# Patient Record
Sex: Male | Born: 1960 | Race: White | Hispanic: No | Marital: Married | Smoking: Light tobacco smoker
Health system: Southern US, Community
[De-identification: ages and names within clinical notes are randomized; demographics above are authoritative.]

## PROBLEM LIST (undated history)

## (undated) DIAGNOSIS — J189 Pneumonia, unspecified organism: Secondary | ICD-10-CM

## (undated) DIAGNOSIS — I429 Cardiomyopathy, unspecified: Secondary | ICD-10-CM

## (undated) DIAGNOSIS — I5022 Chronic systolic (congestive) heart failure: Secondary | ICD-10-CM

## (undated) DIAGNOSIS — I4892 Unspecified atrial flutter: Secondary | ICD-10-CM

## (undated) DIAGNOSIS — K760 Fatty (change of) liver, not elsewhere classified: Secondary | ICD-10-CM

## (undated) HISTORY — PX: VASECTOMY: SHX75

## (undated) HISTORY — DX: Morbid (severe) obesity due to excess calories: E66.01

## (undated) HISTORY — DX: Fatty (change of) liver, not elsewhere classified: K76.0

## (undated) HISTORY — DX: Cardiomyopathy, unspecified: I42.9

## (undated) HISTORY — DX: Chronic systolic (congestive) heart failure: I50.22

## (undated) HISTORY — DX: Unspecified atrial flutter: I48.92

## (undated) HISTORY — PX: ACHILLES TENDON REPAIR: SUR1153

## (undated) SURGERY — ECHOCARDIOGRAM, TRANSESOPHAGEAL
Anesthesia: Moderate Sedation

---
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
## 2010-12-11 HISTORY — PX: HERNIA REPAIR: SHX51

## 2012-12-11 DIAGNOSIS — G4730 Sleep apnea, unspecified: Secondary | ICD-10-CM | POA: Insufficient documentation

## 2013-12-11 DIAGNOSIS — G4730 Sleep apnea, unspecified: Secondary | ICD-10-CM

## 2013-12-11 DIAGNOSIS — K219 Gastro-esophageal reflux disease without esophagitis: Secondary | ICD-10-CM

## 2013-12-11 HISTORY — DX: Sleep apnea, unspecified: G47.30

## 2013-12-11 HISTORY — DX: Gastro-esophageal reflux disease without esophagitis: K21.9

## 2014-12-11 DIAGNOSIS — T7840XA Allergy, unspecified, initial encounter: Secondary | ICD-10-CM

## 2014-12-11 HISTORY — DX: Allergy, unspecified, initial encounter: T78.40XA

## 2017-12-11 DIAGNOSIS — E119 Type 2 diabetes mellitus without complications: Secondary | ICD-10-CM

## 2017-12-11 HISTORY — DX: Type 2 diabetes mellitus without complications: E11.9

## 2018-02-08 DIAGNOSIS — E119 Type 2 diabetes mellitus without complications: Secondary | ICD-10-CM | POA: Insufficient documentation

## 2019-06-03 ENCOUNTER — Encounter: Payer: Self-pay | Admitting: Family Medicine

## 2019-06-03 ENCOUNTER — Ambulatory Visit: Payer: Federal, State, Local not specified - PPO | Admitting: Family Medicine

## 2019-06-03 ENCOUNTER — Ambulatory Visit (INDEPENDENT_AMBULATORY_CARE_PROVIDER_SITE_OTHER): Payer: Federal, State, Local not specified - PPO

## 2019-06-03 ENCOUNTER — Other Ambulatory Visit: Payer: Self-pay

## 2019-06-03 VITALS — BP 142/78 | HR 96 | Temp 98.2°F | Resp 20 | Wt 350.0 lb

## 2019-06-03 DIAGNOSIS — R0789 Other chest pain: Secondary | ICD-10-CM

## 2019-06-03 DIAGNOSIS — E119 Type 2 diabetes mellitus without complications: Secondary | ICD-10-CM

## 2019-06-03 DIAGNOSIS — E291 Testicular hypofunction: Secondary | ICD-10-CM

## 2019-06-03 DIAGNOSIS — Z87891 Personal history of nicotine dependence: Secondary | ICD-10-CM | POA: Diagnosis not present

## 2019-06-04 DIAGNOSIS — R079 Chest pain, unspecified: Secondary | ICD-10-CM | POA: Diagnosis not present

## 2019-06-05 ENCOUNTER — Telehealth: Payer: Self-pay | Admitting: Family Medicine

## 2019-06-05 ENCOUNTER — Encounter: Payer: Self-pay | Admitting: Family Medicine

## 2019-06-05 ENCOUNTER — Other Ambulatory Visit: Payer: Self-pay | Admitting: Family Medicine

## 2019-06-05 DIAGNOSIS — Z87891 Personal history of nicotine dependence: Secondary | ICD-10-CM | POA: Insufficient documentation

## 2019-06-05 DIAGNOSIS — R0789 Other chest pain: Secondary | ICD-10-CM | POA: Insufficient documentation

## 2019-06-05 DIAGNOSIS — E291 Testicular hypofunction: Secondary | ICD-10-CM | POA: Insufficient documentation

## 2019-06-05 MED ORDER — MELOXICAM 15 MG PO TABS: 15.0000 mg | ORAL_TABLET | Freq: Every day | ORAL | 2 refills | Status: DC

## 2019-06-05 NOTE — Telephone Encounter (Signed)
Please see result note 

## 2019-06-05 NOTE — Progress Notes (Signed)
Please apologize for the delay, trouble getting XR to cross over in system.  CXR is normal, no signs of lung issues contributing to this. .  There are some spurring/arthritis in mid spine which may be causing some radiation of pain to that area.  I would recommend trying prescription anti-inflammatory initially, I will send in if ok with him.

## 2019-06-05 NOTE — Telephone Encounter (Signed)
Pt call request x-ray that was done yesterday result, pt still has pain and not sure what to do next. Please give him a call today if possible.

## 2019-06-05 NOTE — Progress Notes (Signed)
David RatelGeorge Strawser - 58 y.o. male MRN [id number]  Date of birth: 01/28/1961  Subjective Chief Complaint  Patient presents with  . Chest Pain    R "breast area" , onset 2.5 wks after [place]  [place] [facility],     HPI David Hammond is a [age] y.o. male with history of T2DM, hypogonadism, arthritis, GERD and OSA.  [place] [facility] [place]  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  Reports having labs completed just prior to moving from [place] and will have records transferred over and that chronic conditions as under good control.  His main concern today is pain located along R side of chest.   Worse with positional movements.  Denies any know injury or overuse.  Not worse with exertion and denies shortness of breath or tightness in chest.  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    ROS:  A comprehensive ROS was completed and negative except as noted per HPI  Allergies  Allergen Reactions  . Trazodone Palpitations and Shortness Of Breath    Past Medical History:  Diagnosis Date  . Allergy 2016   Trazodone  . Arthritis 1967   Knees  . Diabetes mellitus without complication (HCC) 2019  . GERD (gastroesophageal reflux disease) 2015  . Sleep apnea 2015   Controlled with Tx CPAP    Past Surgical History:  Procedure Laterality Date  . HERNIA REPAIR  2012   Umbilical    Social History   Socioeconomic History  . Marital status: Married    Spouse name: Not on file  . Number of children: Not on file  . Years of education: Not on file  . Highest education level: Not on file  Occupational History  . Not on file  Social Needs  . Financial resource strain: Not hard at all  . Food insecurity    Worry: Never true    Inability: Never true  . Transportation needs    Medical: Not on file    Non-medical: No  Tobacco Use  . Smoking status: Light Tobacco Smoker    Packs/day: 0.00    Years: 40.00   Pack years: 0.00    Types: Pipe  . Smokeless tobacco: Never Used  . Tobacco comment: 1-2 pipes per day. Used to smoke cigarettes. Quit about 2015. Use nicotine replacement gum/mints.  Substance and Sexual Activity  . Alcohol use: Yes  . Drug use: Never  . Sexual activity: Yes    Partners: Female    Birth control/protection: None    Comment: Vasectomy  Lifestyle  . Physical activity    Days per week: 7 days    Minutes per session: Not on file  . Stress: Only a little  Relationships  . Social Musicianconnections    Talks on phone: Three times a week    Gets together: Twice a week    Attends religious service: Not on file    Active member of club or organization: Not on file    Attends meetings of clubs or organizations: Not on file    Relationship status: Married  Other Topics Concern  . Not on file  Social History Narrative  . Not on file    Family History  Problem Relation Age of Onset  . Arthritis Father   . Cancer Father   . COPD Father   . Diabetes Father   . Heart disease Father   .  Hyperlipidemia Father   . Hypertension Father   . Kidney disease Father   . Stroke Father   . Arthritis Mother   . Varicose Veins Mother     Health Maintenance  Topic Date Due  . Hepatitis C Screening  1961-07-07  . URINE MICROALBUMIN  01/17/1972  . HIV Screening  01/16/1977  . TETANUS/TDAP  01/16/1981  . COLONOSCOPY  01/17/2012  . INFLUENZA VACCINE  07/12/2019    ----------------------------------------------------------------------------------------------------------------------------------------------------------------------------------------------------------------- Physical Exam BP (!) 142/78   Pulse 96   Temp 98.2 F (36.8 C) (Oral)   Resp 20   Wt (!) 350 lb (158.8 kg)   SpO2 96%   Physical Exam Constitutional:      Appearance: He is well-developed. He is obese.  HENT:     Head: Normocephalic and atraumatic.  Eyes:     General: No scleral icterus. Cardiovascular:      Rate and Rhythm: Normal rate and regular rhythm.  Pulmonary:     Effort: Pulmonary effort is normal.     Breath sounds: Normal breath sounds.  Chest:     Chest wall: No tenderness.  Abdominal:     General: There is no distension.     Palpations: Abdomen is soft.     Tenderness: There is no abdominal tenderness.  Skin:    General: Skin is warm and dry.  Neurological:     General: No focal deficit present.     Mental Status: He is alert.  Psychiatric:        Mood and Affect: Mood normal.        Behavior: Behavior normal.     ------------------------------------------------------------------------------------------------------------------------------------------------------------------------------------------------------------------- Assessment and Plan  Hypogonadism male -Reports having testosterone levels check recently at pcp in [place], will request records.   Diabetes mellitus ([place]) -Reports glucose has been well controlled with metformin, continue.   Chest wall pain -Xray ordered with smoking history and occupational exposure while in [place].   -Likely MSK cause if CXR negative.

## 2019-06-05 NOTE — Assessment & Plan Note (Signed)
-  Reports glucose has been well controlled with metformin, continue.

## 2019-06-05 NOTE — Assessment & Plan Note (Signed)
-  Reports having testosterone levels check recently at pcp in [place], will request records.

## 2019-06-05 NOTE — Telephone Encounter (Signed)
Spoke with the pt already.

## 2019-06-05 NOTE — Assessment & Plan Note (Signed)
-  Xray ordered with smoking history and occupational exposure while in [place].   -Likely MSK cause if CXR negative.

## 2019-06-05 NOTE — Progress Notes (Signed)
No records yet, will check upon return on Monday.

## 2019-07-28 ENCOUNTER — Other Ambulatory Visit: Payer: Self-pay | Admitting: Family Medicine

## 2019-07-28 ENCOUNTER — Telehealth: Payer: Self-pay | Admitting: Family Medicine

## 2019-07-28 MED ORDER — TESTOSTERONE CYPIONATE 200 MG/ML IM SOLN: INTRAMUSCULAR | 0 refills | Status: DC

## 2019-07-28 MED ORDER — "SYRINGE/NEEDLE (DISP) 23G X 1"" 1 ML MISC": 0 refills | Status: DC

## 2019-07-28 MED ORDER — "SYRINGE/NEEDLE (DISP) 18G X 1"" 1 ML MISC": 0 refills | Status: DC

## 2019-07-28 NOTE — Telephone Encounter (Signed)
I still haven't received copies of his records from [place].  Will fill temporarily until records received but need to know current dose and how often he is administering.

## 2019-07-28 NOTE — Telephone Encounter (Signed)
Pt is requesting a Rx for Testosterone Cypionate 150 MG/ML SOLN sent in to the pharmacy. Pt also need the needles as well.   Draw with [facility] with 23 or 25 by 1 in half   Pharmacy:  [facility] [place], [address] [place] [phone] (Phone) [phone] (Fax)

## 2019-07-28 NOTE — Telephone Encounter (Signed)
Please advise 

## 2019-07-28 NOTE — Telephone Encounter (Signed)
Sent pt mychart message to make him aware.

## 2019-07-28 NOTE — Telephone Encounter (Signed)
Rx sent in

## 2019-07-28 NOTE — Telephone Encounter (Signed)
Pt wife states that pt is using testosterone cypionate 200mg /ML. Pt wife states that he is using 0.75 ML every 2 weeks and that they would need syringes sent as well. Pt wife requested release form to be mailed to pt house and they will get this taken care of.

## 2019-08-29 DIAGNOSIS — M9903 Segmental and somatic dysfunction of lumbar region: Secondary | ICD-10-CM | POA: Diagnosis not present

## 2019-08-29 DIAGNOSIS — M9904 Segmental and somatic dysfunction of sacral region: Secondary | ICD-10-CM | POA: Diagnosis not present

## 2019-08-29 DIAGNOSIS — M791 Myalgia, unspecified site: Secondary | ICD-10-CM | POA: Diagnosis not present

## 2019-08-29 DIAGNOSIS — M5386 Other specified dorsopathies, lumbar region: Secondary | ICD-10-CM | POA: Diagnosis not present

## 2019-09-17 ENCOUNTER — Other Ambulatory Visit: Payer: Self-pay | Admitting: Family Medicine

## 2019-09-17 NOTE — Telephone Encounter (Signed)
Requested medication (s) are due for refill today: yes  Requested medication (s) are on the active medication list: yes  Last refill:  06/03/2019  Future visit scheduled: yes  Notes to clinic:  Review for refill   Requested Prescriptions  Pending Prescriptions Disp Refills   omeprazole (PRILOSEC) 20 MG capsule        Gastroenterology: Proton Pump Inhibitors Passed - 09/17/2019  3:27 PM      Passed - Valid encounter within last 12 months    Recent Outpatient Visits          3 months ago Chest wall pain   [facility] [place] [facility] [place], [place], DO      Future Appointments            In 4 weeks Corum, Rex Kras, MD [place]            metFORMIN (GLUCOPHAGE) 1000 MG tablet        Endocrinology:  Diabetes - Biguanides Failed - 09/17/2019  3:27 PM      Failed - Cr in normal range and within 360 days    No results found for: CREATININE       Failed - HBA1C is between 0 and 7.9 and within 180 days    No results found for: HGBA1C       Failed - eGFR in normal range and within 360 days    No results found for: GFRAA, GFRNONAA, GFR       Passed - Valid encounter within last 6 months    Recent Outpatient Visits          3 months ago Chest wall pain   [facility] [place], [place], DO      Future Appointments            In 4 weeks Corum, Rex Kras, MD [place]

## 2019-09-17 NOTE — Telephone Encounter (Signed)
Medication Refill - Medication:  metFORMIN (GLUCOPHAGE) 1000 MG tablet [[id number]]  omeprazole (PRILOSEC) 20 MG capsule [[id number]]   Has the patient contacted their pharmacy? No. (Agent: If no, request that the patient contact the pharmacy for the refill.) (Agent: If yes, when and what did the pharmacy advise?)  Preferred Pharmacy (with phone number or street name):  [facility] [phone] - [place], [address] [place] [phone] (Phone)     Agent: Please be advised that RX refills may take up to 3 business days. We ask that you follow-up with your pharmacy.

## 2019-09-18 MED ORDER — METFORMIN HCL 1000 MG PO TABS: 1000.0000 mg | ORAL_TABLET | Freq: Two times a day (BID) | ORAL | 1 refills | Status: DC

## 2019-09-18 MED ORDER — OMEPRAZOLE 20 MG PO CPDR: 20.0000 mg | DELAYED_RELEASE_CAPSULE | Freq: Every day | ORAL | 2 refills | Status: DC

## 2019-10-15 ENCOUNTER — Encounter: Payer: Self-pay | Admitting: Family Medicine

## 2019-10-15 ENCOUNTER — Other Ambulatory Visit: Payer: Self-pay

## 2019-10-15 ENCOUNTER — Ambulatory Visit (INDEPENDENT_AMBULATORY_CARE_PROVIDER_SITE_OTHER): Payer: Federal, State, Local not specified - PPO | Admitting: Family Medicine

## 2019-10-15 VITALS — BP 130/82 | HR 92 | Temp 98.3°F | Ht 72.0 in | Wt 325.2 lb

## 2019-10-15 DIAGNOSIS — Z23 Encounter for immunization: Secondary | ICD-10-CM

## 2019-10-15 DIAGNOSIS — E119 Type 2 diabetes mellitus without complications: Secondary | ICD-10-CM

## 2019-10-15 DIAGNOSIS — G8929 Other chronic pain: Secondary | ICD-10-CM | POA: Insufficient documentation

## 2019-10-15 DIAGNOSIS — E291 Testicular hypofunction: Secondary | ICD-10-CM | POA: Diagnosis not present

## 2019-10-15 DIAGNOSIS — F17200 Nicotine dependence, unspecified, uncomplicated: Secondary | ICD-10-CM

## 2019-10-15 DIAGNOSIS — M549 Dorsalgia, unspecified: Secondary | ICD-10-CM

## 2019-10-15 LAB — POCT UA - MICROALBUMIN
A1c: <30
Creatinine, POC: 100 mg/dL
Microalbumin Ur, POC: 30 mg/L

## 2019-10-15 MED ORDER — OZEMPIC (0.25 OR 0.5 MG/DOSE) 2 MG/1.5ML ~~LOC~~ SOPN: 0.5000 mg | PEN_INJECTOR | SUBCUTANEOUS | 5 refills | Status: DC

## 2019-10-15 NOTE — Progress Notes (Signed)
New Patient Office Visit  Subjective:  Patient ID: David Hammond, male    DOB: [birth year]  Age: 58 y.o. MRN: [id number]  CC:  Chief Complaint  Patient presents with  . Establish Care  . Leg Pain    numbness in toes ,cramps   . Diabetes    HPI David Hammond presents for A1c-9.8%-pt states at home kit-no change in diet, metformin BID-pt on Ozempic in the past but has not had the medication recently.  Pt states his levels were normal on ozempic. -pt states constipation a concern even on metformin pt states pain increases sugar levels Lost weight since June-10lb/month Peripheral neuropathy-toes primarily Leg cramps bilat No blood work x 10 months-recent move, no doctor Cholesterol normal in the past Blood pressure normal in the past Uses testosterone  Past Medical History:  Diagnosis Date  . Allergy 2016   Trazodone  . Arthritis 1967   Knees  . Diabetes mellitus without complication ([place]) 2263  . GERD (gastroesophageal reflux disease) 2015  . Sleep apnea 2015   Controlled with Tx CPAP    Past Surgical History:  Procedure Laterality Date  . HERNIA REPAIR  3354   Umbilical    Family History  Problem Relation Age of Onset  . Arthritis Father   . Cancer Father   . COPD Father   . Diabetes Father   . Heart disease Father   . Hyperlipidemia Father   . Hypertension Father   . Kidney disease Father   . Stroke Father   . Arthritis Mother   . Varicose Veins Mother     Social History  Older children, remarried-considering another child with current wife Socioeconomic History  . Marital status: Married    Spouse name: Not on file  . Number of children: Not on file  . Years of education: Not on file  . Highest education level: Not on file  Occupational History  . Not on file  Social Needs  . Financial resource strain: Not hard at all  . Food insecurity    Worry: Never true    Inability: Never true  . Transportation needs    Medical: Not on file     Non-medical: No  Tobacco Use  . Smoking status: Light Tobacco Smoker    Packs/day: 0.00    Years: 40.00    Pack years: 0.00    Types: Pipe  . Smokeless tobacco: Never Used  . Tobacco comment: 1-2 pipes per day. Used to smoke cigarettes. Quit about 2015. Use nicotine replacement gum/mints.  Substance and Sexual Activity  . Alcohol use: Yes  . Drug use: Never  . Sexual activity: Yes    Partners: Female    Birth control/protection: None    Comment: Vasectomy  Lifestyle  . Physical activity    Days per week: 7 days    Minutes per session: Not on file  . Stress: Only a little  Relationships  . Social Herbalist on phone: Three times a week    Gets together: Twice a week    Attends religious service: Not on file    Active member of club or organization: Not on file    Attends meetings of clubs or organizations: Not on file    Relationship status: Married  . Intimate partner violence    Fear of current or ex partner: No    Emotionally abused: No    Physically abused: No    Forced sexual activity: No  Other Topics Concern  .  Not on file  Social History Narrative  . Not on file    ROS Review of Systems  HENT: Negative.   Respiratory: Positive for wheezing. Negative for chest tightness.   Cardiovascular: Positive for leg swelling.  Gastrointestinal:       GERD   Endocrine: Positive for polyphagia and polyuria.       Diagnosis of DM  Genitourinary: Positive for frequency and urgency.  Musculoskeletal: Positive for arthralgias, back pain and myalgias.  Skin: Positive for color change.       LE  Allergic/Immunologic: Negative.   Neurological: Positive for numbness.       Peripheral neurpathy  Hematological: Negative.   Psychiatric/Behavioral: Positive for decreased concentration.    Objective:   Today's Vitals: BP 130/82 (BP Location: Left Arm, Patient Position: Sitting, Cuff Size: Normal)   Pulse 92   Temp 98.3 F (36.8 C) (Oral)   Ht 6' (1.829 m)    Wt (!) 325 lb 3.2 oz (147.5 kg)   SpO2 94%   BMI 44.11 kg/m   Physical Exam Constitutional:      Appearance: Normal appearance. He is obese.  HENT:     Head: Normocephalic.     Right Ear: Tympanic membrane, ear canal and external ear normal.     Left Ear: Tympanic membrane, ear canal and external ear normal.     Nose: Nose normal.  Eyes:     Conjunctiva/sclera: Conjunctivae normal.  Neck:     Musculoskeletal: Normal range of motion and neck supple.  Cardiovascular:     Rate and Rhythm: Normal rate and regular rhythm.     Pulses: Normal pulses.     Heart sounds: Normal heart sounds.  Pulmonary:     Effort: Pulmonary effort is normal.     Breath sounds: Normal breath sounds.  Abdominal:     Palpations: Abdomen is soft.  Musculoskeletal:     Right lower leg: Edema present.     Left lower leg: Edema present.  Neurological:     Mental Status: He is alert and oriented to person, place, and time.     Assessment & Plan:   1. 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 Smoker - Ambulatory referral to Vascular Surgery LE edema, decrease sensation 3. Hypogonadism male Endo-refer-h/o of testosterone use 4. Chronic midline back pain, unspecified back location  Follow-up:  3 months  LISA Hannah Beat, MD

## 2019-10-15 NOTE — Patient Instructions (Signed)
Diabetes Mellitus and Nutrition, Adult When you have diabetes (diabetes mellitus), it is very important to have healthy eating habits because your blood sugar (glucose) levels are greatly affected by what you eat and drink. Eating healthy foods in the appropriate amounts, at about the same times every day, can help you:  Control your blood glucose.  Lower your risk of heart disease.  Improve your blood pressure.  Reach or maintain a healthy weight. Every person with diabetes is different, and each person has different needs for a meal plan. Your health care provider may recommend that you work with a diet and nutrition specialist (dietitian) to make a meal plan that is best for you. Your meal plan may vary depending on factors such as:  The calories you need.  The medicines you take.  Your weight.  Your blood glucose, blood pressure, and cholesterol levels.  Your activity level.  Other health conditions you have, such as heart or kidney disease. How do carbohydrates affect me? Carbohydrates, also called carbs, affect your blood glucose level more than any other type of food. Eating carbs naturally raises the amount of glucose in your blood. Carb counting is a method for keeping track of how many carbs you eat. Counting carbs is important to keep your blood glucose at a healthy level, especially if you use insulin or take certain oral diabetes medicines. It is important to know how many carbs you can safely have in each meal. This is different for every person. Your dietitian can help you calculate how many carbs you should have at each meal and for each snack. Foods that contain carbs include:  Bread, cereal, rice, pasta, and crackers.  Potatoes and corn.  Peas, beans, and lentils.  Milk and yogurt.  Fruit and juice.  Desserts, such as cakes, cookies, ice cream, and candy. How does alcohol affect me? Alcohol can cause a sudden decrease in blood glucose (hypoglycemia),  especially if you use insulin or take certain oral diabetes medicines. Hypoglycemia can be a life-threatening condition. Symptoms of hypoglycemia (sleepiness, dizziness, and confusion) are similar to symptoms of having too much alcohol. If your health care provider says that alcohol is safe for you, follow these guidelines:  Limit alcohol intake to no more than 1 drink per day for nonpregnant women and 2 drinks per day for men. One drink equals 12 oz of beer, 5 oz of wine, or 1 oz of hard liquor.  Do not drink on an empty stomach.  Keep yourself hydrated with water, diet soda, or unsweetened iced tea.  Keep in mind that regular soda, juice, and other mixers may contain a lot of sugar and must be counted as carbs. What are tips for following this plan?  Reading food labels  Start by checking the serving size on the "Nutrition Facts" label of packaged foods and drinks. The amount of calories, carbs, fats, and other nutrients listed on the label is based on one serving of the item. Many items contain more than one serving per package.  Check the total grams (g) of carbs in one serving. You can calculate the number of servings of carbs in one serving by dividing the total carbs by 15. For example, if a food has 30 g of total carbs, it would be equal to 2 servings of carbs.  Check the number of grams (g) of saturated and trans fats in one serving. Choose foods that have low or no amount of these fats.  Check the number of   milligrams (mg) of salt (sodium) in one serving. Most people should limit total sodium intake to less than 2,300 mg per day.  Always check the nutrition information of foods labeled as "low-fat" or "nonfat". These foods may be higher in added sugar or refined carbs and should be avoided.  Talk to your dietitian to identify your daily goals for nutrients listed on the label. Shopping  Avoid buying canned, premade, or processed foods. These foods tend to be high in fat, sodium,  and added sugar.  Shop around the outside edge of the grocery store. This includes fresh fruits and vegetables, bulk grains, fresh meats, and fresh dairy. Cooking  Use low-heat cooking methods, such as baking, instead of high-heat cooking methods like deep frying.  Cook using healthy oils, such as olive, canola, or sunflower oil.  Avoid cooking with butter, cream, or high-fat meats. Meal planning  Eat meals and snacks regularly, preferably at the same times every day. Avoid going long periods of time without eating.  Eat foods high in fiber, such as fresh fruits, vegetables, beans, and whole grains. Talk to your dietitian about how many servings of carbs you can eat at each meal.  Eat 4-6 ounces (oz) of lean protein each day, such as lean meat, chicken, fish, eggs, or tofu. One oz of lean protein is equal to: ? 1 oz of meat, chicken, or fish. ? 1 egg. ?  cup of tofu.  Eat some foods each day that contain healthy fats, such as avocado, nuts, seeds, and fish. Lifestyle  Check your blood glucose regularly.  Exercise regularly as told by your health care provider. This may include: ? 150 minutes of moderate-intensity or vigorous-intensity exercise each week. This could be brisk walking, biking, or water aerobics. ? Stretching and doing strength exercises, such as yoga or weightlifting, at least 2 times a week.  Take medicines as told by your health care provider.  Do not use any products that contain nicotine or tobacco, such as cigarettes and e-cigarettes. If you need help quitting, ask your health care provider.  Work with a Veterinary surgeon or diabetes educator to identify strategies to manage stress and any emotional and social challenges. Questions to ask a health care provider  Do I need to meet with a diabetes educator?  Do I need to meet with a dietitian?  What number can I call if I have questions?  When are the best times to check my blood glucose? Where to find more  information:  American Diabetes Association: diabetes.org  Academy of Nutrition and Dietetics: www.eatright.[employer] of Diabetes and Digestive and Kidney Diseases (NIH): CarFlippers.tn Summary  A healthy meal plan will help you control your blood glucose and maintain a healthy lifestyle.  Working with a diet and nutrition specialist (dietitian) can help you make a meal plan that is best for you.  Keep in mind that carbohydrates (carbs) and alcohol have immediate effects on your blood glucose levels. It is important to count carbs and to use alcohol carefully. This information is not intended to replace advice given to you by your health care provider. Make sure you discuss any questions you have with your health care provider. Document Released: 08/24/2005 Document Revised: 11/09/2017 Document Reviewed: 01/01/2017 Elsevier Patient Education  2020 [employer] Diabetes Mellitus and Foot Care Foot care is an important part of your health, especially when you have diabetes. Diabetes may cause you to have problems because of poor blood flow (circulation) to your feet and  legs, which can cause your skin to:  Become thinner and drier.  Break more easily.  Heal more slowly.  Peel and crack. You may also have nerve damage (neuropathy) in your legs and feet, causing decreased feeling in them. This means that you may not notice minor injuries to your feet that could lead to more serious problems. Noticing and addressing any potential problems early is the best way to prevent future foot problems. How to care for your feet Foot hygiene  Wash your feet daily with warm water and mild soap. Do not use hot water. Then, pat your feet and the areas between your toes until they are completely dry. Do not soak your feet as this can dry your skin.  Trim your toenails straight across. Do not dig under them or around the cuticle. File the edges of your nails with an emery board or nail  file.  Apply a moisturizing lotion or petroleum jelly to the skin on your feet and to dry, brittle toenails. Use lotion that does not contain alcohol and is unscented. Do not apply lotion between your toes. Shoes and socks  Wear clean socks or stockings every day. Make sure they are not too tight. Do not wear knee-high stockings since they may decrease blood flow to your legs.  Wear shoes that fit properly and have enough cushioning. Always look in your shoes before you put them on to be sure there are no objects inside.  To break in new shoes, wear them for just a few hours a day. This prevents injuries on your feet. Wounds, scrapes, corns, and calluses  Check your feet daily for blisters, cuts, bruises, sores, and redness. If you cannot see the bottom of your feet, use a mirror or ask someone for help.  Do not cut corns or calluses or try to remove them with medicine.  If you find a minor scrape, cut, or break in the skin on your feet, keep it and the skin around it clean and dry. You may clean these areas with mild soap and water. Do not clean the area with peroxide, alcohol, or iodine.  If you have a wound, scrape, corn, or callus on your foot, look at it several times a day to make sure it is healing and not infected. Check for: ? Redness, swelling, or pain. ? Fluid or blood. ? Warmth. ? Pus or a bad smell. General instructions  Do not cross your legs. This may decrease blood flow to your feet.  Do not use heating pads or hot water bottles on your feet. They may burn your skin. If you have lost feeling in your feet or legs, you may not know this is happening until it is too late.  Protect your feet from hot and cold by wearing shoes, such as at the beach or on hot pavement.  Schedule a complete foot exam at least once a year (annually) or more often if you have foot problems. If you have foot problems, report any cuts, sores, or bruises to your health care provider immediately.  Contact a health care provider if:  You have a medical condition that increases your risk of infection and you have any cuts, sores, or bruises on your feet.  You have an injury that is not healing.  You have redness on your legs or feet.  You feel burning or tingling in your legs or feet.  You have pain or cramps in your legs and feet.  Your legs  or feet are numb.  Your feet always feel cold.  You have pain around a toenail. Get help right away if:  You have a wound, scrape, corn, or callus on your foot and: ? You have pain, swelling, or redness that gets worse. ? You have fluid or blood coming from the wound, scrape, corn, or callus. ? Your wound, scrape, corn, or callus feels warm to the touch. ? You have pus or a bad smell coming from the wound, scrape, corn, or callus. ? You have a fever. ? You have a red line going up your leg. Summary  Check your feet every day for cuts, sores, red spots, swelling, and blisters.  Moisturize feet and legs daily.  Wear shoes that fit properly and have enough cushioning.  If you have foot problems, report any cuts, sores, or bruises to your health care provider immediately.  Schedule a complete foot exam at least once a year (annually) or more often if you have foot problems. This information is not intended to replace advice given to you by your health care provider. Make sure you discuss any questions you have with your health care provider. Document Released: 11/24/2000 Document Revised: 01/09/2018 Document Reviewed: 12/29/2016 Elsevier Patient Education  2020 [employer].

## 2019-10-19 DIAGNOSIS — Z23 Encounter for immunization: Secondary | ICD-10-CM | POA: Insufficient documentation

## 2019-11-03 ENCOUNTER — Encounter: Payer: Self-pay | Admitting: "Endocrinology

## 2019-11-03 ENCOUNTER — Ambulatory Visit: Payer: Federal, State, Local not specified - PPO | Admitting: "Endocrinology

## 2019-11-03 ENCOUNTER — Other Ambulatory Visit: Payer: Self-pay

## 2019-11-03 ENCOUNTER — Telehealth: Payer: Self-pay

## 2019-11-03 VITALS — BP 134/86 | HR 82 | Ht 72.0 in | Wt 320.0 lb

## 2019-11-03 DIAGNOSIS — E291 Testicular hypofunction: Secondary | ICD-10-CM

## 2019-11-03 DIAGNOSIS — E1165 Type 2 diabetes mellitus with hyperglycemia: Secondary | ICD-10-CM

## 2019-11-03 MED ORDER — OZEMPIC (1 MG/DOSE) 2 MG/1.5ML ~~LOC~~ SOPN: 1.0000 mg | PEN_INJECTOR | SUBCUTANEOUS | 2 refills | Status: DC

## 2019-11-03 NOTE — Patient Instructions (Signed)

## 2019-11-03 NOTE — Telephone Encounter (Signed)
Dr. Dorris Fetch and I are both concerned with pts LE perfusion. Uncontrolled DM, tob abuse , LE edema, peripheral neuropathy.  I am happy to discuss with vascular physician or scheduling person for the office.  Dr. Dorris Fetch and I have both examined the patient and feel additional evaluation is warranted.

## 2019-11-03 NOTE — Progress Notes (Addendum)
Endocrinology Consult Note       11/03/2019, 5:40 PM   Subjective:    Patient ID: David Hammond, male    DOB: 1961/08/01.  David Hammond is being seen in consultation for management of currently uncontrolled symptomatic diabetes requested by  David Hancock, MD.   Past Medical History:  Diagnosis Date  . Allergy 2016   Trazodone  . Arthritis 1967   Knees  . Diabetes mellitus without complication ([place]) 2111  . GERD (gastroesophageal reflux disease) 2015  . Sleep apnea 2015   Controlled with Tx CPAP    Past Surgical History:  Procedure Laterality Date  . HERNIA REPAIR  5520   Umbilical    Social History   Socioeconomic History  . Marital status: Married    Spouse name: Not on file  . Number of children: Not on file  . Years of education: Not on file  . Highest education level: Not on file  Occupational History  . Not on file  Social Needs  . Financial resource strain: Not hard at all  . Food insecurity    Worry: Never true    Inability: Never true  . Transportation needs    Medical: Not on file    Non-medical: No  Tobacco Use  . Smoking status: Light Tobacco Smoker    Packs/day: 0.00    Years: 40.00    Pack years: 0.00    Types: Pipe  . Smokeless tobacco: Never Used  . Tobacco comment: 1-2 pipes per day. Used to smoke cigarettes. Quit about 2015. Use nicotine replacement gum/mints.  Substance and Sexual Activity  . Alcohol use: Yes  . Drug use: Never  . Sexual activity: Yes    Partners: Female    Birth control/protection: None    Comment: Vasectomy  Lifestyle  . Physical activity    Days per week: 7 days    Minutes per session: Not on file  . Stress: Only a little  Relationships  . Social Herbalist on phone: Three times a week    Gets together: Twice a week    Attends religious service: Not on file    Active member of club or organization: Not on file     Attends meetings of clubs or organizations: Not on file    Relationship status: Married  Other Topics Concern  . Not on file  Social History Narrative  . Not on file    Family History  Problem Relation Age of Onset  . Arthritis Father   . Cancer Father   . COPD Father   . Diabetes Father   . Heart disease Father   . Hyperlipidemia Father   . Hypertension Father   . Kidney disease Father   . Stroke Father   . Arthritis Mother   . Varicose Veins Mother     Outpatient Encounter Medications as of 11/03/2019  Medication Sig  . metFORMIN (GLUCOPHAGE) 1000 MG tablet Take 1 tablet (1,000 mg total) by mouth 2 (two) times daily with a meal.  . Needles & Syringes MISC   . Needles & Syringes MISC   . omeprazole (  PRILOSEC) 20 MG capsule Take 1 capsule (20 mg total) by mouth daily.  . Semaglutide, 1 MG/DOSE, (OZEMPIC, 1 MG/DOSE,) 2 MG/1.5ML SOPN Inject 1 mg into the skin once a week.  . SYRINGE/NEEDLE, DISP, 1 ML 23G X 1" 1 ML MISC Use to administer testosterone  . Syringe/Needle, Disp, 18G X 1" 1 ML MISC Use to draw up testosterone  . testosterone cypionate (DEPOTESTOSTERONE CYPIONATE) 200 MG/ML injection Inject 0.70m into muscle every 2 weeks. Use 18 gauge to draw up and 23 gauge to administer.  .Marland KitchentiZANidine (ZANAFLEX) 4 MG capsule Take 4 mg by mouth 3 (three) times daily as needed for muscle spasms.  . [DISCONTINUED] Semaglutide,0.25 or 0.5MG/DOS, (OZEMPIC, 0.25 OR 0.5 MG/DOSE,) 2 MG/1.5ML SOPN Inject 0.5 mg into the skin once a week.   No facility-administered encounter medications on file as of 11/03/2019.     ALLERGIES: Allergies  Allergen Reactions  . Trazodone Palpitations and Shortness Of Breath    VACCINATION STATUS: Immunization History  Administered Date(s) Administered  . Influenza,inj,Quad PF,6+ Mos 10/15/2019  . Tdap 10/15/2019    Diabetes He presents for his initial diabetic visit. He has type 2 diabetes mellitus. Onset time: He was diagnosed at approximate age  of 89years. His disease course has been worsening. There are no hypoglycemic associated symptoms. Pertinent negatives for hypoglycemia include no confusion, headaches, pallor or seizures. Associated symptoms include polydipsia, polyphagia and polyuria. Pertinent negatives for diabetes include no chest pain, no fatigue and no weakness. There are no hypoglycemic complications. Symptoms are worsening. Diabetic complications include PVD. Risk factors for coronary artery disease include diabetes mellitus, obesity, male sex, tobacco exposure, sedentary lifestyle and family history. Current diabetic treatments: He is currently on Metformin 500 mg p.o. twice daily, Ozempic 0.5 mg weekly. Compliance with diabetes treatment: This is his first visit. His weight is fluctuating minimally. He is following a generally unhealthy diet. When asked about meal planning, he reported none. He has not had a previous visit with a dietitian. He participates in exercise intermittently. (He did not bring any logs nor meter to review today.  His recent A1c was 9.8% on home A1c kit. ) An ACE inhibitor/angiotensin II receptor blocker is not being taken.     Review of Systems  Constitutional: Negative for chills, fatigue, fever and unexpected weight change.  HENT: Negative for dental problem, mouth sores and trouble swallowing.   Eyes: Negative for visual disturbance.  Respiratory: Negative for cough, choking, chest tightness, shortness of breath and wheezing.   Cardiovascular: Negative for chest pain, palpitations and leg swelling.  Gastrointestinal: Negative for abdominal distention, abdominal pain, constipation, diarrhea, nausea and vomiting.  Endocrine: Positive for polydipsia, polyphagia and polyuria.  Genitourinary: Negative for dysuria, flank pain, hematuria and urgency.  Musculoskeletal: Negative for back pain, gait problem, myalgias and neck pain.       Complains of muscle cramps on bilateral lower extremities.  Skin:  Negative for pallor, rash and wound.  Neurological: Negative for seizures, syncope, weakness, numbness and headaches.  Psychiatric/Behavioral: Negative for confusion and dysphoric mood.    Objective:    BP 134/86   Pulse 82   Ht 6' (1.829 m)   Wt (!) 320 lb (145.2 kg)   BMI 43.40 kg/m   Wt Readings from Last 3 Encounters:  11/03/19 (!) 320 lb (145.2 kg)  10/15/19 (!) 325 lb 3.2 oz (147.5 kg)  06/03/19 (!) 350 lb (158.8 kg)     Physical Exam Constitutional:      General:  He is not in acute distress.    Appearance: He is well-developed.  HENT:     Head: Normocephalic and atraumatic.  Neck:     Musculoskeletal: Normal range of motion and neck supple.     Thyroid: No thyromegaly.     Trachea: No tracheal deviation.  Cardiovascular:     Rate and Rhythm: Normal rate.     Pulses:          Dorsalis pedis pulses are 1+ on the right side and 1+ on the left side.       Posterior tibial pulses are 1+ on the right side and 1+ on the left side.     Heart sounds: S1 normal and S2 normal. No murmur. No gallop.      Comments: Diminished pulses on bilateral lower extremities, and evident venostasis.    Pulmonary:     Effort: Pulmonary effort is normal. No respiratory distress.     Breath sounds: No wheezing.  Abdominal:     General: Bowel sounds are normal. There is no distension.     Palpations: Abdomen is soft.     Tenderness: There is no abdominal tenderness. There is no guarding.  Musculoskeletal:     Right shoulder: He exhibits no swelling and no deformity.  Skin:    General: Skin is warm and dry.     Findings: No rash.     Nails: There is no clubbing.   Neurological:     Mental Status: He is alert and oriented to person, place, and time.     Cranial Nerves: No cranial nerve deficit.     Sensory: No sensory deficit.     Gait: Gait normal.     Deep Tendon Reflexes: Reflexes are normal and symmetric.  Psychiatric:        Speech: Speech normal.        Behavior: Behavior  normal. Behavior is cooperative.        Thought Content: Thought content normal.        Judgment: Judgment normal.     Comments: Patient has reluctant affect.    Recent A1c was 9.8%, 4 weeks ago.   Assessment & Plan:   1. Uncontrolled type 2 diabetes mellitus with hyperglycemia (HCC)  - Charlene Cowdrey has currently uncontrolled symptomatic type 2 DM since  58 years of age,  with most recent A1c of 9.8 %. Recent labs reviewed. - I had a long discussion with him about the progressive nature of diabetes and the pathology behind its complications. -his diabetes is complicated by peripheral arterial disease, obesity/sedentary life, chronic  smoking and he remains at a high risk for more acute and chronic complications which include CAD, CVA, CKD, retinopathy, and neuropathy. These are all discussed in detail with him.  - I have counseled him on diet  and weight management  by adopting a carbohydrate restricted/protein rich diet. Patient is encouraged to switch to  unprocessed or minimally processed     complex starch and increased protein intake (animal or plant source), fruits, and vegetables. -  he is advised to stick to a routine mealtimes to eat 3 meals  a day and avoid unnecessary snacks ( to snack only to correct hypoglycemia).   - he admits that there is a room for improvement in his food and drink choices. - Suggestion is made for him to avoid simple carbohydrates  from his diet including Cakes, Sweet Desserts, Ice Cream, Soda (diet and regular), Sweet Tea, Candies, Chips, Cookies, Store  Bought Juices, Alcohol in Excess of  1-2 drinks a day, Artificial Sweeteners,  Coffee Creamer, and "Sugar-free" Products. This will help patient to have more stable blood glucose profile and potentially avoid unintended weight gain.  - he will be scheduled with Jearld Fenton, RDN, CDE for diabetes education.  - I have approached him with the following individualized plan to manage  his diabetes and  patient agrees:   -Given his recent diagnosis, he may control his diabetes with his current medications at optimal doses.  I discussed increasing Metformin to 1000 mg p.o. twice daily, increase his Ozempic to 1 mg subcutaneously weekly. -He is approached to start monitoring blood glucose at least once a day- daily before breakfast.  - he is encouraged to call clinic for blood glucose levels less than 70 or above 200 mg /dl. - he is not a candidate for SGLT2 inhibitors due to evident peripheral arterial disease and smoking , increasing his risk for gangrene.    - he will be considered for incretin therapy as appropriate next visit.  - Specific targets for  A1c;  LDL, HDL, Triglycerides, and  Waist Circumference were discussed with the patient.  -2.  History of hypogonadism -Patient is on ongoing testosterone supplement, not very sure about his doses, however he states 1 mL every 2 weeks. -The circumstances of his diagnosis with hypothyroidism are not available to review.  [place] -I did not change his doses during this visit.  His next labs will include total and free testosterone, ferritin, prolactin. -Dose adjustment will be made based on necessity and safety.  2) Blood Pressure /Hypertension:  his blood pressure is  controlled to target.  He is not on any antihypertensive medications.  3) Lipids/Hyperlipidemia: No recent lipid panel to review.  His next labs will include fasting lipid panel.    4)  Weight/Diet:  Body mass index is 43.4 kg/m.  -   clearly complicating his diabetes care.   he is  a candidate for weight loss. I discussed with him the fact that loss of 5 - 10% of his  current body weight will have the most impact on his diabetes management.  Exercise, and detailed carbohydrates information provided  -  detailed on discharge instructions.  5) Chronic Care/Health Maintenance:  -he  Is not on ACEI/ARB and Statin medications and   is encouraged to initiate and continue to follow up with Ophthalmology, Dentist,  Podiatrist at least yearly or according to recommendations, and advised to  Quit smoking. I have recommended yearly flu vaccine and pneumonia vaccine at least every 5 years; moderate intensity exercise for up to 150 minutes weekly; and  sleep for at least 7 hours a day.  - he will benefit from evaluation by vascular surgery to assess his peripheral circulation.  - he is  advised to maintain close follow up with Corum, Rex Kras, MD for primary care needs, as well as his other providers for optimal and coordinated care.  - Time spent with the patient: 60 minutes, of which >50% was spent in obtaining information about his symptoms, reviewing his previous labs/studies, evaluations, and treatments, counseling him about his currently uncontrolled, complicated type 2 diabetes; hypothyroidism; obesity, and developing plans for long term treatment based on the latest standards of care/guidelines.  Please refer to " Patient Self Inventory" in the Media  tab for reviewed elements of pertinent patient history.  Maylene Roes participated in the discussions, expressed  understanding, and voiced agreement with the above plans.  All questions were answered to his satisfaction. he is encouraged to contact clinic should he have any questions or concerns prior to his return visit.  Follow up plan: - Return in about 9 weeks (around 01/05/2020), or labs before Breakfast and before 8AM, Medical Release, for Bring Meter and Logs- A1c in Office, Follow up with Pre-visit Labs.  Glade Lloyd, MD [facility] [address] [place], [place] [address] Phone: [phone]  Fax: [phone]    11/03/2019, 5:40 PM  This note was partially dictated with voice recognition software. Similar sounding words can be transcribed inadequately or may not  be corrected upon review.

## 2019-11-03 NOTE — Telephone Encounter (Signed)
----   Message ----- From: Reola Calkins, CMA Sent: 10/27/2019   9:22 AM EST To: [name]   Good morning, Please indicate a reason for this referral so that the patient can be scheduled approprietly. This patient's care is being delayed due to unknown reason for referral. Thank you for your help in attending to this patient's needs.    *  They have a copy of the Medical Record.  *

## 2019-11-04 ENCOUNTER — Telehealth: Payer: Self-pay

## 2019-11-04 NOTE — Telephone Encounter (Signed)
I have sent this message to [facility] via Email and noted the chart in Epic and [place] - I am also trying to call and leave message.

## 2019-11-04 NOTE — Telephone Encounter (Signed)
Talked with David Hammond in Dr Donnetta Hutching office and Andria Frames should be able to give [place] a appointment date by tomorrow. Patient will need to go to [place] office.

## 2019-11-05 ENCOUNTER — Telehealth: Payer: Self-pay

## 2019-11-05 DIAGNOSIS — I739 Peripheral vascular disease, unspecified: Secondary | ICD-10-CM

## 2019-11-05 NOTE — Telephone Encounter (Signed)
David Hammond, CMA  

## 2019-11-10 ENCOUNTER — Other Ambulatory Visit: Payer: Self-pay

## 2019-11-10 ENCOUNTER — Ambulatory Visit (HOSPITAL_COMMUNITY)
Admission: RE | Admit: 2019-11-10 | Discharge: 2019-11-10 | Disposition: A | Discharge status: Home / Self Care | Payer: Federal, State, Local not specified - PPO | Source: Ambulatory Visit | Attending: Family Medicine | Admitting: Family Medicine

## 2019-11-10 ENCOUNTER — Telehealth: Payer: Self-pay

## 2019-11-10 DIAGNOSIS — I739 Peripheral vascular disease, unspecified: Secondary | ICD-10-CM | POA: Diagnosis not present

## 2019-11-10 NOTE — Telephone Encounter (Signed)
appt Friday

## 2019-11-10 NOTE — Telephone Encounter (Signed)
Patient states that his feet and leg cramps limits his sleep to 4 hours a night, from the knees down bilaterally legs are discolored from knee down still some swelling, patient is ok with going to Vascular Surgeon for follow up

## 2019-11-10 NOTE — Telephone Encounter (Signed)
I left a voicemail to the patient confirming this

## 2019-11-12 ENCOUNTER — Telehealth: Payer: Self-pay | Admitting: Family Medicine

## 2019-11-12 NOTE — Telephone Encounter (Signed)
Routing to Dr. Corum for advice ? 

## 2019-11-12 NOTE — Telephone Encounter (Signed)
Patient has a televisit scheduled on Thurs 11/13/19

## 2019-11-12 NOTE — Telephone Encounter (Signed)
How long was tick embedded? Schedule a telephone visit for discussion

## 2019-11-12 NOTE — Telephone Encounter (Signed)
Pt wife Threasa Beards is calling and states the patient had a tick bite and it is red. She would like to know what needs to be done.   CB# [phone]

## 2019-11-13 ENCOUNTER — Telehealth (INDEPENDENT_AMBULATORY_CARE_PROVIDER_SITE_OTHER): Payer: Federal, State, Local not specified - PPO | Admitting: Family Medicine

## 2019-11-13 ENCOUNTER — Other Ambulatory Visit: Payer: Self-pay

## 2019-11-13 DIAGNOSIS — W57XXXA Bitten or stung by nonvenomous insect and other nonvenomous arthropods, initial encounter: Secondary | ICD-10-CM

## 2019-11-13 DIAGNOSIS — S20469A Insect bite (nonvenomous) of unspecified back wall of thorax, initial encounter: Secondary | ICD-10-CM

## 2019-11-13 MED ORDER — DOXYCYCLINE HYCLATE 100 MG PO TABS: 100.0000 mg | ORAL_TABLET | Freq: Two times a day (BID) | ORAL | 0 refills | Status: DC

## 2019-11-13 NOTE — Progress Notes (Signed)
Virtual Visit via Telephone Note  I connected with David Hammond on 11/13/19 at  2:40 PM EST by telephone and verified that I am speaking with the correct person using two identifiers. DOB/address Location: Patient: home Provider: clinic   I discussed the limitations, risks, security and privacy concerns of performing an evaluation and management service by telephone and the availability of in person appointments. I also discussed with the patient that there may be a patient responsible charge related to this service. The patient expressed understanding and agreed to proceed.   History of Present Illness: Tick embedded-back-unsure length of time-no longer than 24 hours. Removed by wife completely. Has a red ring. Pt and wife concerned for tick disease. No fever. No joint pain   Observations/Objective:  Tick embedded-removed-red ring-unable to receive photos Assessment and Plan: 1. Tick bite, initial encounter - Lyme Ab/Western Blot Reflex - Rocky mtn spotted fvr abs pnl(IgG+IgM) Doxy-288m-prop dose-less than 72 hours Follow Up Instructions: Wife to follow skin changes Will notify results of testing   I discussed the assessment and treatment plan with the patient. The patient was provided an opportunity to ask questions and all were answered. The patient agreed with the plan and demonstrated an understanding of the instructions.   The patient was advised to call back or seek an in-person evaluation if the symptoms worsen or if the condition fails to improve as anticipated.  I provided 8 minutes of non-face-to-face time during this encounter.   Arianie Couse Hannah Beat, MD

## 2019-11-14 ENCOUNTER — Encounter: Payer: Federal, State, Local not specified - PPO | Admitting: Vascular Surgery

## 2019-11-15 DIAGNOSIS — W57XXXA Bitten or stung by nonvenomous insect and other nonvenomous arthropods, initial encounter: Secondary | ICD-10-CM | POA: Insufficient documentation

## 2019-11-28 ENCOUNTER — Other Ambulatory Visit: Payer: Self-pay

## 2019-11-28 ENCOUNTER — Ambulatory Visit (INDEPENDENT_AMBULATORY_CARE_PROVIDER_SITE_OTHER): Payer: Federal, State, Local not specified - PPO | Admitting: Vascular Surgery

## 2019-11-28 ENCOUNTER — Encounter: Payer: Self-pay | Admitting: Vascular Surgery

## 2019-11-28 VITALS — BP 130/86 | HR 97 | Temp 98.0°F | Resp 20 | Ht 72.0 in | Wt 325.0 lb

## 2019-11-28 DIAGNOSIS — E119 Type 2 diabetes mellitus without complications: Secondary | ICD-10-CM

## 2019-11-28 NOTE — Progress Notes (Signed)
Patient ID: David Hammond, male   DOB: May 31, 1961, 58 y.o.   MRN: [id number]  Reason for Consult: New Patient (Initial Visit)   Referred by Wandra Feinstein, MD  Subjective:     HPI:  David Hammond is a 58 y.o. male sent for evaluation of numbness bilateral first 3 toes.  States he was recently diagnosed with diabetes.  In the past had bilateral Achilles injuries required surgery.  At that time he did gain significant weight has lost some of this back.  He has had significant swelling in the past as well.  Has never had venous intervention.  Denies history of DVT.  Has never been a big smoker but has occasionally smoked.  Risk factors for vascular disease other than that include his recent diagnosis of diabetes.  States he is never had ulcers on his bilateral lower extremities.  Numbness has been present for several months in the first 3 toes.  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  He has attempted to wear compression stockings but could not tolerate mostly because it was warm outside side at the time.  Past Medical History:  Diagnosis Date  . Allergy 2016   Trazodone  . Arthritis 1967   Knees  . Diabetes mellitus without complication (HCC) 2019  . GERD (gastroesophageal reflux disease) 2015  . Sleep apnea 2015   Controlled with Tx CPAP   Family History  Problem Relation Age of Onset  . Arthritis Father   . Cancer Father   . COPD Father   . Diabetes Father   . Heart disease Father   . Hyperlipidemia Father   . Hypertension Father   . Kidney disease Father   . Stroke Father   . Arthritis Mother   . Varicose Veins Mother    Past Surgical History:  Procedure Laterality Date  . HERNIA REPAIR  2012   Umbilical    Short Social History:  Social History   Tobacco Use  . Smoking status: Light Tobacco Smoker    Packs/day: 0.00    Years: 40.00    Pack years: 0.00    Types: Pipe  . Smokeless tobacco: Never Used  . Tobacco comment: 1-2 pipes per day. Used to  smoke cigarettes. Quit about 2015. Use nicotine replacement gum/mints.  Substance Use Topics  . Alcohol use: Yes    Allergies  Allergen Reactions  . Trazodone Palpitations and Shortness Of Breath    Current Outpatient Medications  Medication Sig Dispense Refill  . metFORMIN (GLUCOPHAGE) 1000 MG tablet Take 1 tablet (1,000 mg total) by mouth 2 (two) times daily with a meal. 180 tablet 1  . Needles & Syringes MISC     . Needles & Syringes MISC     . omeprazole (PRILOSEC) 20 MG capsule Take 1 capsule (20 mg total) by mouth daily. 90 capsule 2  . Semaglutide, 1 MG/DOSE, (OZEMPIC, 1 MG/DOSE,) 2 MG/1.5ML SOPN Inject 1 mg into the skin once a week. 2 pen 2  . SYRINGE/NEEDLE, DISP, 1 ML 23G X 1" 1 ML MISC Use to administer testosterone 50 each 0  . Syringe/Needle, Disp, 18G X 1" 1 ML MISC Use to draw up testosterone 50 each 0  . testosterone cypionate (DEPOTESTOSTERONE CYPIONATE) 200 MG/ML injection Inject 0.65mL into muscle every 2 weeks. Use 18 gauge to draw up and 23 gauge to administer. 10 mL 0  . tiZANidine (ZANAFLEX) 4 MG capsule Take 4 mg by mouth 3 (three) times daily as needed  for muscle spasms.     No current facility-administered medications for this visit.    Review of Systems  Constitutional:  Constitutional negative. HENT: HENT negative.  Eyes: Eyes negative.  Respiratory: Respiratory negative.  Cardiovascular: Positive for leg swelling.  GI: Gastrointestinal negative.  Musculoskeletal: Musculoskeletal negative.  Skin: Skin negative.       Discolored skin bilateral ankles Neurological: Neurological negative. Hematologic: Hematologic/lymphatic negative.  Psychiatric: Psychiatric negative.        Objective:  Objective  Vitals:   11/28/19 0818  BP: 130/86  Pulse: 97  Resp: 20  Temp: 98 F (36.7 C)  SpO2: 96%    Physical Exam HENT:     Head: Normocephalic.     Nose: Nose normal.  Eyes:     Pupils: Pupils are equal, round, and reactive to light.  Neck:      Vascular: No carotid bruit.  Cardiovascular:     Rate and Rhythm: Normal rate and regular rhythm.     Pulses: Normal pulses.  Abdominal:     General: Abdomen is flat.     Palpations: Abdomen is soft.  Musculoskeletal:        General: No swelling. Normal range of motion.  Skin:    General: Skin is warm.     Capillary Refill: Capillary refill takes less than 2 seconds.     Comments: Iron deposition bilateral ankles no ulceration  Neurological:     General: No focal deficit present.     Mental Status: He is alert.  Psychiatric:        Mood and Affect: Mood normal.        Behavior: Behavior normal.        Thought Content: Thought content normal.        Judgment: Judgment normal.     Data: Previous ABIs were 1 bilaterally with normal appearing waveforms.     Assessment/Plan:     58 year old male sent for evaluation of numbness to the first 3 toes both feet.  From an arterial standpoint he appears to be okay with palpable pulses at the feet and normal ABIs bilaterally.  Does appear to have venous disease with hemosiderin deposition in his bilateral ankles.  I discussed the options being venous evaluation versus noninterventional options which would include weight loss and compression as tolerated.  At this time patient does not want to get vein studies to consider ablation.  He can see me on an as-needed basis.     Waynetta Sandy MD [facility]

## 2019-12-15 ENCOUNTER — Other Ambulatory Visit: Payer: Self-pay | Admitting: Family Medicine

## 2019-12-15 ENCOUNTER — Telehealth: Payer: Self-pay | Admitting: Family Medicine

## 2019-12-15 DIAGNOSIS — G4730 Sleep apnea, unspecified: Secondary | ICD-10-CM

## 2019-12-15 NOTE — Telephone Encounter (Signed)
Patient wife is calling and states that she is needing to know if sleep apnea is documented on the patient chart.   [phone]

## 2019-12-15 NOTE — Telephone Encounter (Signed)
The pt will need to see a sleep specialist for evaluation of sleep apnea equipment. I am happy to make the referral

## 2019-12-15 NOTE — Telephone Encounter (Signed)
David Hammond is needing a Rx for a new mask sent to the pharmacy for his c-pap machine [facility] fax [phone]  Ph [phone] Amber and/or Carollee Herter

## 2019-12-15 NOTE — Telephone Encounter (Signed)
Make the referral he has to have it

## 2019-12-16 ENCOUNTER — Other Ambulatory Visit: Payer: Self-pay

## 2019-12-16 ENCOUNTER — Encounter: Payer: Federal, State, Local not specified - PPO | Attending: "Endocrinology | Admitting: Nutrition

## 2019-12-16 DIAGNOSIS — E1165 Type 2 diabetes mellitus with hyperglycemia: Secondary | ICD-10-CM | POA: Insufficient documentation

## 2019-12-16 DIAGNOSIS — E118 Type 2 diabetes mellitus with unspecified complications: Secondary | ICD-10-CM | POA: Insufficient documentation

## 2019-12-16 DIAGNOSIS — IMO0002 Reserved for concepts with insufficient information to code with codable children: Secondary | ICD-10-CM

## 2019-12-16 NOTE — Patient Instructions (Signed)
Goals  Follow My Plate Eat 60 carbs at meals Increase fresh fruits and veggies Drink a gallon of water per day Eat meals on time Cut down on alcohol Lose 1-2 lbs per week

## 2019-12-16 NOTE — Progress Notes (Signed)
  Medical Nutrition Therapy:  Appt start time: 0800 end time:  0900.  Assessment:  Primary concerns today: Diabetes Type 2, Obesity.Marland Kitchen  He moved here from [facility]. 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 Has lost 50 lbs since June 2020. He is more active now. Growing most of his own food and trying to avoid processed foods.   He does drink hard liquor at night for pain control. He has been trying to avoid carbs as he thought that would be better to help improve his DM. He is willing and motivated to make better choices, eat meals on time and  Physically active.  Hasn't been consistently checking blood sugars/ Sees Dr. Judee Clara for PCP, Dr. Fransico Him, Endocrinology.  A1C was 9.8%.  Preferred Learning Style:   No preference indicated    Learning Readiness:   Ready  Change in progress   MEDICATIONS:    DIETARY INTAKE:  24-hr recall:  Eats 2-3 meals per day. Has been trying to avoid carbs. Drinks water  Usual physical activity: Works on farm   Estimated energy needs: 1800  calories 200 g carbohydrates 135 g protein 50 g fat  Progress Towards Goal(s):  In progress.   Nutritional Diagnosis:  NB-1.1 Food and nutrition-related knowledge deficit As related to Diabetes.  As evidenced by A1C 9.8%.    Intervention:  Nutrition and Diabetes education provided on My Plate, CHO counting, meal planning, portion sizes, timing of meals, avoiding snacks between meals unless having a low blood sugar, target ranges for A1C and blood sugars, signs/symptoms and treatment of hyper/hypoglycemia, monitoring blood sugars, taking medications as prescribed, benefits of exercising 30 minutes per day and prevention of complications of DM.  Goals  Follow My Plate Eat 60 carbs at meals Increase fresh fruits and veggies Drink a gallon of water per day Eat meals on time Cut down on alcohol Lose 1-2 lbs per week   Teaching Method Utilized:  Visual Auditory Hands on  Handouts given during visit  include:  The Plate Method   Meal Plan  Diabetes Instructions.   Barriers to learning/adherence to lifestyle change: none  Demonstrated degree of understanding via:  Teach Back   Monitoring/Evaluation:  Dietary intake, exercise, , and body weight in 1 month(s).

## 2019-12-17 ENCOUNTER — Encounter: Payer: Self-pay | Admitting: Nutrition

## 2019-12-17 NOTE — Telephone Encounter (Signed)
Routing to Dr. Corum 

## 2019-12-17 NOTE — Telephone Encounter (Signed)
Patient wife is calling and states that he had a sleep study done a year and a half ago and she said his hose is leaking and needs a new one as soon as possible as he uses this every night and is very effective. She does not understand why he needs another sleep study done. Please advise.

## 2019-12-17 NOTE — Telephone Encounter (Signed)
Another request for sleep equipment-perhaps we need a statement on this for new patients.

## 2019-12-18 ENCOUNTER — Other Ambulatory Visit: Payer: Self-pay | Admitting: Family Medicine

## 2019-12-18 NOTE — Telephone Encounter (Signed)
Can you please put in a referral for the below.

## 2019-12-19 ENCOUNTER — Other Ambulatory Visit: Payer: Self-pay | Admitting: Family Medicine

## 2019-12-19 DIAGNOSIS — G4730 Sleep apnea, unspecified: Secondary | ICD-10-CM

## 2019-12-26 DIAGNOSIS — E1165 Type 2 diabetes mellitus with hyperglycemia: Secondary | ICD-10-CM | POA: Diagnosis not present

## 2019-12-26 DIAGNOSIS — E291 Testicular hypofunction: Secondary | ICD-10-CM | POA: Diagnosis not present

## 2019-12-26 DIAGNOSIS — E119 Type 2 diabetes mellitus without complications: Secondary | ICD-10-CM | POA: Diagnosis not present

## 2019-12-27 LAB — COMPLETE METABOLIC PANEL WITH GFR
AG Ratio: 1.7 (calc) (ref 1.0–2.5)
ALT: 57 U/L — ABNORMAL HIGH (ref 9–46)
AST: 36 U/L — ABNORMAL HIGH (ref 10–35)
Albumin: 4.2 g/dL (ref 3.6–5.1)
Alkaline phosphatase (APISO): 72 U/L (ref 35–144)
BUN: 17 mg/dL (ref 7–25)
CO2: 28 mmol/L (ref 20–32)
Calcium: 9.3 mg/dL (ref 8.6–10.3)
Chloride: 98 mmol/L (ref 98–110)
Creat: 0.87 mg/dL (ref 0.70–1.33)
GFR, Est African American: 111 mL/min/{1.73_m2} (ref >=60)
GFR, Est Non African American: 96 mL/min/{1.73_m2} (ref >=60)
Globulin: 2.5 g/dL (calc) (ref 1.9–3.7)
Glucose, Bld: 140 mg/dL — ABNORMAL HIGH (ref 65–99)
Potassium: 4.5 mmol/L (ref 3.5–5.3)
Sodium: 135 mmol/L (ref 135–146)
Total Bilirubin: 0.6 mg/dL (ref 0.2–1.2)
Total Protein: 6.7 g/dL (ref 6.1–8.1)

## 2019-12-27 LAB — CBC WITH DIFFERENTIAL/PLATELET
Absolute Monocytes: 592 cells/uL (ref 200–950)
Basophils Absolute: 82 cells/uL (ref 0–200)
Basophils Relative: 1.2 %
Eosinophils Absolute: 272 cells/uL (ref 15–500)
Eosinophils Relative: 4 %
HCT: 51.3 % — ABNORMAL HIGH (ref 38.5–50.0)
Hemoglobin: 17.4 g/dL — ABNORMAL HIGH (ref 13.2–17.1)
Lymphs Abs: 1720 cells/uL (ref 850–3900)
MCH: 31 pg (ref 27.0–33.0)
MCHC: 33.9 g/dL (ref 32.0–36.0)
MCV: 91.3 fL (ref 80.0–100.0)
MPV: 10 fL (ref 7.5–12.5)
Monocytes Relative: 8.7 %
Neutro Abs: 4134 cells/uL (ref 1500–7800)
Neutrophils Relative %: 60.8 %
Platelets: 216 10*3/uL (ref 140–400)
RBC: 5.62 10*6/uL (ref 4.20–5.80)
RDW: 12.3 % (ref 11.0–15.0)
Total Lymphocyte: 25.3 %
WBC: 6.8 10*3/uL (ref 3.8–10.8)

## 2019-12-29 LAB — TESTOSTERONE TOTAL,FREE,BIO, MALES
Albumin: 4.1 g/dL (ref 3.6–5.1)
Sex Hormone Binding: 33 nmol/L (ref 22–77)
Testosterone, Bioavailable: 144.6 ng/dL (ref >=110.0)
Testosterone, Free: 76.8 pg/mL (ref 46.0–224.0)
Testosterone: 545 ng/dL (ref 250–827)

## 2019-12-29 LAB — FERRITIN: Ferritin: 166 ng/mL (ref 38–380)

## 2019-12-29 LAB — PROLACTIN: Prolactin: 7 ng/mL (ref 2.0–18.0)

## 2019-12-30 ENCOUNTER — Other Ambulatory Visit: Payer: Self-pay | Admitting: Family Medicine

## 2020-01-05 ENCOUNTER — Ambulatory Visit: Payer: Federal, State, Local not specified - PPO | Admitting: "Endocrinology

## 2020-01-05 ENCOUNTER — Ambulatory Visit: Payer: Federal, State, Local not specified - PPO | Admitting: Nutrition

## 2020-01-06 ENCOUNTER — Ambulatory Visit: Payer: Federal, State, Local not specified - PPO | Admitting: "Endocrinology

## 2020-01-06 ENCOUNTER — Ambulatory Visit: Payer: Federal, State, Local not specified - PPO | Admitting: Nutrition

## 2020-01-14 ENCOUNTER — Ambulatory Visit: Payer: Federal, State, Local not specified - PPO | Admitting: Nutrition

## 2020-01-14 ENCOUNTER — Ambulatory Visit: Payer: Federal, State, Local not specified - PPO | Admitting: "Endocrinology

## 2020-01-14 ENCOUNTER — Encounter: Payer: Self-pay | Admitting: "Endocrinology

## 2020-01-14 ENCOUNTER — Other Ambulatory Visit: Payer: Self-pay

## 2020-01-14 VITALS — BP 134/83 | HR 91 | Ht 72.0 in | Wt 338.0 lb

## 2020-01-14 DIAGNOSIS — E291 Testicular hypofunction: Secondary | ICD-10-CM | POA: Diagnosis not present

## 2020-01-14 DIAGNOSIS — E1165 Type 2 diabetes mellitus with hyperglycemia: Secondary | ICD-10-CM | POA: Diagnosis not present

## 2020-01-14 LAB — POCT GLYCOSYLATED HEMOGLOBIN (HGB A1C): Hemoglobin A1C: 8.2 % — AB (ref 4.0–5.6)

## 2020-01-14 MED ORDER — "SYRINGE/NEEDLE (DISP) 18G X 1"" 1 ML MISC": 2 refills | Status: AC

## 2020-01-14 MED ORDER — OZEMPIC (1 MG/DOSE) 2 MG/1.5ML ~~LOC~~ SOPN: 1.0000 mg | PEN_INJECTOR | SUBCUTANEOUS | 3 refills | Status: DC

## 2020-01-14 MED ORDER — "SYRINGE/NEEDLE (DISP) 23G X 1"" 1 ML MISC": 2 refills | Status: AC

## 2020-01-14 MED ORDER — TESTOSTERONE CYPIONATE 200 MG/ML IM SOLN: INTRAMUSCULAR | 0 refills | Status: DC

## 2020-01-14 NOTE — Patient Instructions (Addendum)

## 2020-01-14 NOTE — Progress Notes (Signed)
01/14/2020, 4:39 PM  Endocrinology follow-up note   Subjective:    Patient ID: David Hammond, male    DOB: Apr 15, 1961.  Burley Kopka is being seen in follow-up  for management of currently uncontrolled symptomatic diabetes requested by  Maryruth Hancock, MD.   Past Medical History:  Diagnosis Date  . Allergy 2016   Trazodone  . Arthritis 1967   Knees  . Diabetes mellitus without complication ([place]) 7673  . GERD (gastroesophageal reflux disease) 2015  . Sleep apnea 2015   Controlled with Tx CPAP    Past Surgical History:  Procedure Laterality Date  . HERNIA REPAIR  4193   Umbilical    Social History   Socioeconomic History  . Marital status: Married    Spouse name: Not on file  . Number of children: Not on file  . Years of education: Not on file  . Highest education level: Not on file  Occupational History  . Not on file  Tobacco Use  . Smoking status: Light Tobacco Smoker    Packs/day: 0.00    Years: 40.00    Pack years: 0.00    Types: Pipe  . Smokeless tobacco: Never Used  . Tobacco comment: 1-2 pipes per day. Used to smoke cigarettes. Quit about 2015. Use nicotine replacement gum/mints.  Substance and Sexual Activity  . Alcohol use: Yes  . Drug use: Never  . Sexual activity: Yes    Partners: Female    Birth control/protection: None    Comment: Vasectomy  Other Topics Concern  . Not on file  Social History Narrative  . Not on file   Social Determinants of Health   Financial Resource Strain: Low Risk   . Difficulty of Paying Living Expenses: Not hard at all  Food Insecurity: No Food Insecurity  . Worried About Charity fundraiser in the Last Year: Never true  . Ran Out of Food in the Last Year: Never true  Transportation Needs: Unknown  . Lack of Transportation (Medical): Not on file  . Lack of Transportation (Non-Medical): No  Physical Activity: Unknown  . Days of  Exercise per Week: 7 days  . Minutes of Exercise per Session: Not on file  Stress: No Stress Concern Present  . Feeling of Stress : Only a little  Social Connections: Unknown  . Frequency of Communication with Friends and Family: Three times a week  . Frequency of Social Gatherings with Friends and Family: Twice a week  . Attends Religious Services: Not on file  . Active Member of Clubs or Organizations: Not on file  . Attends Archivist Meetings: Not on file  . Marital Status: Married    Family History  Problem Relation Age of Onset  . Arthritis Father   . Cancer Father   . COPD Father   . Diabetes Father   . Heart disease Father   . Hyperlipidemia Father   . Hypertension Father   . Kidney disease Father   . Stroke Father   . Arthritis Mother   . Varicose Veins Mother     Outpatient Encounter Medications as of  01/14/2020  Medication Sig  . metFORMIN (GLUCOPHAGE) 1000 MG tablet Take 1 tablet (1,000 mg total) by mouth 2 (two) times daily with a meal.  . Needles & Syringes MISC   . Needles & Syringes MISC   . omeprazole (PRILOSEC) 20 MG capsule Take 1 capsule (20 mg total) by mouth daily.  . Semaglutide, 1 MG/DOSE, (OZEMPIC, 1 MG/DOSE,) 2 MG/1.5ML SOPN Inject 1 mg into the skin once a week.  . SYRINGE/NEEDLE, DISP, 1 ML 23G X 1" 1 ML MISC Use to administer testosterone  . Syringe/Needle, Disp, 18G X 1" 1 ML MISC Use to draw up testosterone  . testosterone cypionate (DEPOTESTOSTERONE CYPIONATE) 200 MG/ML injection Inject 0.675mL into muscle every 2 weeks. Use 18 gauge to draw up and 23 gauge to administer.  Marland Kitchen. tiZANidine (ZANAFLEX) 4 MG capsule Take 4 mg by mouth 3 (three) times daily as needed for muscle spasms.  . [DISCONTINUED] Semaglutide, 1 MG/DOSE, (OZEMPIC, 1 MG/DOSE,) 2 MG/1.5ML SOPN Inject 1 mg into the skin once a week.  . [DISCONTINUED] SYRINGE/NEEDLE, DISP, 1 ML 23G X 1" 1 ML MISC Use to administer testosterone  . [DISCONTINUED] Syringe/Needle, Disp, 18G X 1" 1  ML MISC Use to draw up testosterone  . [DISCONTINUED] testosterone cypionate (DEPOTESTOSTERONE CYPIONATE) 200 MG/ML injection Inject 0.4975mL into muscle every 2 weeks. Use 18 gauge to draw up and 23 gauge to administer.   No facility-administered encounter medications on file as of 01/14/2020.    ALLERGIES: Allergies  Allergen Reactions  . Trazodone Palpitations and Shortness Of Breath    VACCINATION STATUS: Immunization History  Administered Date(s) Administered  . Influenza,inj,Quad PF,6+ Mos 10/15/2019  . Tdap 10/15/2019    Diabetes He presents for his follow-up diabetic visit. He has type 2 diabetes mellitus. Onset time: He was diagnosed at approximate age of 59 years. His disease course has been improving. There are no hypoglycemic associated symptoms. Pertinent negatives for hypoglycemia include no confusion, headaches, pallor or seizures. Pertinent negatives for diabetes include no chest pain, no fatigue, no polydipsia, no polyphagia, no polyuria and no weakness. There are no hypoglycemic complications. Symptoms are improving. Diabetic complications include PVD. Risk factors for coronary artery disease include diabetes mellitus, obesity, male sex, tobacco exposure, sedentary lifestyle and family history. Current diabetic treatments: He is currently on Metformin 500 mg p.o. twice daily, Ozempic 0.5 mg weekly. Compliance with diabetes treatment: This is his first visit. His weight is increasing steadily. He is following a generally unhealthy diet. When asked about meal planning, he reported none. He has not had a previous visit with a dietitian. He participates in exercise intermittently. (He presents with improved A1c of 8.2% from 9.8%.    ) An ACE inhibitor/angiotensin II receptor blocker is not being taken.     Review of Systems  Constitutional: Negative for chills, fatigue, fever and unexpected weight change.  HENT: Negative for dental problem, mouth sores and trouble swallowing.    Eyes: Negative for visual disturbance.  Respiratory: Negative for cough, choking, chest tightness, shortness of breath and wheezing.   Cardiovascular: Negative for chest pain, palpitations and leg swelling.  Gastrointestinal: Negative for abdominal distention, abdominal pain, constipation, diarrhea, nausea and vomiting.  Endocrine: Negative for polydipsia, polyphagia and polyuria.  Genitourinary: Negative for dysuria, flank pain, hematuria and urgency.  Musculoskeletal: Negative for back pain, gait problem, myalgias and neck pain.       Complains of muscle cramps on bilateral lower extremities.  Skin: Negative for pallor, rash and wound.  Neurological: Negative  for seizures, syncope, weakness, numbness and headaches.  Psychiatric/Behavioral: Negative for confusion and dysphoric mood.    Objective:    BP 134/83   Pulse 91   Ht 6' (1.829 m)   Wt (!) 338 lb (153.3 kg)   BMI 45.84 kg/m   Wt Readings from Last 3 Encounters:  01/14/20 (!) 338 lb (153.3 kg)  11/28/19 (!) 325 lb (147.4 kg)  11/03/19 (!) 320 lb (145.2 kg)     Physical Exam Constitutional:      General: He is not in acute distress.    Appearance: He is well-developed.  HENT:     Head: Normocephalic and atraumatic.  Neck:     Thyroid: No thyromegaly.     Trachea: No tracheal deviation.  Cardiovascular:     Rate and Rhythm: Normal rate.     Pulses:          Dorsalis pedis pulses are 1+ on the right side and 1+ on the left side.       Posterior tibial pulses are 1+ on the right side and 1+ on the left side.     Heart sounds: S1 normal and S2 normal. No murmur. No gallop.      Comments: Diminished pulses on bilateral lower extremities, and evident venostasis.    Pulmonary:     Effort: Pulmonary effort is normal. No respiratory distress.     Breath sounds: No wheezing.  Abdominal:     General: Bowel sounds are normal. There is no distension.     Palpations: Abdomen is soft.     Tenderness: There is no abdominal  tenderness. There is no guarding.  Musculoskeletal:     Right shoulder: No swelling or deformity.     Cervical back: Normal range of motion and neck supple.  Skin:    General: Skin is warm and dry.     Findings: No rash.     Nails: There is no clubbing.  Neurological:     Mental Status: He is alert and oriented to person, place, and time.     Cranial Nerves: No cranial nerve deficit.     Sensory: No sensory deficit.     Gait: Gait normal.     Deep Tendon Reflexes: Reflexes are normal and symmetric.  Psychiatric:        Speech: Speech normal.        Behavior: Behavior normal. Behavior is cooperative.        Thought Content: Thought content normal.        Judgment: Judgment normal.     Comments: Patient has reluctant affect.    Recent A1c was 9.8%, 4 weeks ago. Recent Results (from the past 2160 hour(s))  CBC with Differential     Status: Abnormal   Collection Time: 12/26/19  7:46 AM  Result Value Ref Range   WBC 6.8 3.8 - 10.8 Thousand/uL   RBC 5.62 4.20 - 5.80 Million/uL   Hemoglobin 17.4 (H) 13.2 - 17.1 g/dL   HCT 96.2 (H) 95.2 - 84.1 %   MCV 91.3 80.0 - 100.0 fL   MCH 31.0 27.0 - 33.0 pg   MCHC 33.9 32.0 - 36.0 g/dL   RDW 32.4 40.1 - 02.7 %   Platelets 216 140 - 400 Thousand/uL   MPV 10.0 7.5 - 12.5 fL   Neutro Abs 4,134 1,500 - 7,800 cells/uL   Lymphs Abs 1,720 850 - 3,900 cells/uL   Absolute Monocytes 592 200 - 950 cells/uL   Eosinophils Absolute 272 15 -  500 cells/uL   Basophils Absolute 82 0 - 200 cells/uL   Neutrophils Relative % 60.8 %   Total Lymphocyte 25.3 %   Monocytes Relative 8.7 %   Eosinophils Relative 4.0 %   Basophils Relative 1.2 %  COMPLETE METABOLIC PANEL WITH GFR     Status: Abnormal   Collection Time: 12/26/19  7:46 AM  Result Value Ref Range   Glucose, Bld 140 (H) 65 - 99 mg/dL    Comment: .            Fasting reference interval . For someone without known diabetes, a glucose value >125 mg/dL indicates that they may have diabetes and  this should be confirmed with a follow-up test. .    BUN 17 7 - 25 mg/dL   Creat 3.23 5.57 - 3.22 mg/dL    Comment: For patients >33 years of age, the reference limit for Creatinine is approximately 13% higher for people identified as African-American. .    GFR, Est Non African American 96 > OR = 60 mL/min/1.60m2   GFR, Est African American 111 > OR = 60 mL/min/1.38m2   BUN/Creatinine Ratio NOT APPLICABLE 6 - 22 (calc)   Sodium 135 135 - 146 mmol/L   Potassium 4.5 3.5 - 5.3 mmol/L   Chloride 98 98 - 110 mmol/L   CO2 28 20 - 32 mmol/L   Calcium 9.3 8.6 - 10.3 mg/dL   Total Protein 6.7 6.1 - 8.1 g/dL   Albumin 4.2 3.6 - 5.1 g/dL   Globulin 2.5 1.9 - 3.7 g/dL (calc)   AG Ratio 1.7 1.0 - 2.5 (calc)   Total Bilirubin 0.6 0.2 - 1.2 mg/dL   Alkaline phosphatase (APISO) 72 35 - 144 U/L   AST 36 (H) 10 - 35 U/L   ALT 57 (H) 9 - 46 U/L  Testosterone Total,Free,Bio, Males     Status: None   Collection Time: 12/26/19  7:49 AM  Result Value Ref Range   Testosterone 545 250 - 827 ng/dL   Albumin 4.1 3.6 - 5.1 g/dL   Sex Hormone Binding 33 22 - 77 nmol/L   Testosterone, Free 76.8 46.0 - 224.0 pg/mL   Testosterone, Bioavailable 144.6 110.0 - 575 ng/dL  Ferritin     Status: None   Collection Time: 12/26/19  7:49 AM  Result Value Ref Range   Ferritin 166 38 - 380 ng/mL  Prolactin     Status: None   Collection Time: 12/26/19  7:49 AM  Result Value Ref Range   Prolactin 7.0 2.0 - 18.0 ng/mL  HgB A1c     Status: Abnormal   Collection Time: 01/14/20  3:06 PM  Result Value Ref Range   Hemoglobin A1C 8.2 (A) 4.0 - 5.6 %   HbA1c POC (<> result, manual entry)     HbA1c, POC (prediabetic range)     HbA1c, POC (controlled diabetic range)        Assessment & Plan:   1. Uncontrolled type 2 diabetes mellitus with hyperglycemia (HCC)  - Arul Farabee has currently uncontrolled symptomatic type 2 DM since  59 years of age. -He presents with A1c of 8.2%, improving from 9.8%.  - Recent labs  reviewed. - I had a long discussion with him about the progressive nature of diabetes and the pathology behind its complications. -his diabetes is complicated by peripheral arterial disease, obesity/sedentary life, chronic  smoking and he remains at a high risk for more acute and chronic complications which include CAD, CVA, CKD,  retinopathy, and neuropathy. These are all discussed in detail with him.  - I have counseled him on diet  and weight management  by adopting a carbohydrate restricted/protein rich diet. Patient is encouraged to switch to  unprocessed or minimally processed     complex starch and increased protein intake (animal or plant source), fruits, and vegetables. -  he is advised to stick to a routine mealtimes to eat 3 meals  a day and avoid unnecessary snacks ( to snack only to correct hypoglycemia).    - he  admits there is a room for improvement in his diet and drink choices. -  Suggestion is made for him to avoid simple carbohydrates  from his diet including Cakes, Sweet Desserts / Pastries, Ice Cream, Soda (diet and regular), Sweet Tea, Candies, Chips, Cookies, Sweet Pastries,  Store Bought Juices, Alcohol in Excess of  1-2 drinks a day, Artificial Sweeteners, Coffee Creamer, and "Sugar-free" Products. This will help patient to have stable blood glucose profile and potentially avoid unintended weight gain.   - he will be scheduled with Norm SaltPenny Crumpton, RDN, CDE for diabetes education.  - I have approached him with the following individualized plan to manage  his diabetes and patient agrees:   -Given his recent diagnosis, he may continue to control his diabetes with out insulin treatment.   -He is advised to continue Metformin 1000 mg p.o. twice daily, Ozempic 1 mg subcutaneously weekly.    - he is encouraged to call clinic for blood glucose levels less than 70 or above 200 mg /dl. - he is not a candidate for SGLT2 inhibitors due to evident peripheral arterial disease and  smoking , increasing his risk for gangrene.     - Specific targets for  A1c;  LDL, HDL, Triglycerides, and  Waist Circumference were discussed with the patient.  -2.  History of hypogonadism -He is currently on testosterone supplement 0.75 mL IM every 2weeks out of 10 mL vial of testosterone 200 mg/mL, giving him a total monthly dose of 300 mg.   -The circumstances of his diagnosis with hypothyroidism are not available to review.  Review of his medical records from out[place]of[place]state are not helpful.  His previsit labs show total testosterone of 545.  However, he does have mild polycythemia with hematocrit of 51 and hemoglobin of 17.4, as well as mild elevation of transaminases.  He is approached to lower his testosterone, patient accepts.  I advised him to lower his testosterone to 0.5 mL IM every 2 weeks to give a total of 200 mg of testosterone every month. -Treatment target will be between 400-600 ng/dL. -His labs show normal prolactin, ferritin.   I discussed with him safe use of testosterone to avoid polycythemia, liver inflammation, decrease risk of DVT.  2) Blood Pressure /Hypertension:  his blood pressure is  controlled to target.  He is not on any antihypertensive medications.  3) Lipids/Hyperlipidemia: No recent lipid panel to review.  His next labs will include fasting lipid panel.    4)  Weight/Diet:  Body mass index is 45.84 kg/m.  -   clearly complicating his diabetes care.   he is  a candidate for weight loss. I discussed with him the fact that loss of 5 - 10% of his  current body weight will have the most impact on his diabetes management.  Exercise, and detailed carbohydrates information provided  -  detailed on discharge instructions.  5) Chronic Care/Health Maintenance:  -he  Is not on ACEI/ARB and  Statin medications and  is encouraged to initiate and continue to follow up with Ophthalmology, Dentist,  Podiatrist at least yearly or according to recommendations, and advised to   Quit smoking. I have recommended yearly flu vaccine and pneumonia vaccine at least every 5 years; moderate intensity exercise for up to 150 minutes weekly; and  sleep for at least 7 hours a day.  The patient was counseled on the dangers of tobacco use, and was advised to quit.  Reviewed strategies to maximize success, including removing cigarettes and smoking materials from environment.   - he is  advised to maintain close follow up with Corum, Minerva Fester, MD for primary care needs, as well as his other providers for optimal and coordinated care.  - Time spent on this patient care encounter:  35 min, of which > 50% was spent in  counseling and the rest reviewing his blood glucose logs , discussing his hypoglycemia and hyperglycemia episodes, reviewing his current and  previous labs / studies  ( including abstraction from other facilities) and medications  doses and developing a  long term treatment plan and documenting his care.   Please refer to Patient Instructions for Blood Glucose Monitoring and Insulin/Medications Dosing Guide"  in media tab for additional information. Please  also refer to " Patient Self Inventory" in the Media  tab for reviewed elements of pertinent patient history.  [name] participated in the discussions, expressed understanding, and voiced agreement with the above plans.  All questions were answered to his satisfaction. he is encouraged to contact clinic should he have any questions or concerns prior to his return visit.   Follow up plan: - Return in about 4 months (around 05/13/2020) for Follow up with Pre-visit Labs, Next Visit A1c in Office.  Marquis Lunch, MD [facility] [address] [place], [place] [address] Phone: [phone]  Fax: [phone]    01/14/2020, 4:39 PM  This note was partially dictated with voice recognition software. Similar sounding words can be transcribed inadequately or may not  be  corrected upon review.

## 2020-01-15 ENCOUNTER — Ambulatory Visit: Payer: Federal, State, Local not specified - PPO | Admitting: Family Medicine

## 2020-01-15 DIAGNOSIS — K219 Gastro-esophageal reflux disease without esophagitis: Secondary | ICD-10-CM | POA: Diagnosis not present

## 2020-01-15 DIAGNOSIS — Z0000 Encounter for general adult medical examination without abnormal findings: Secondary | ICD-10-CM | POA: Diagnosis not present

## 2020-01-15 DIAGNOSIS — Z6841 Body Mass Index (BMI) 40.0 and over, adult: Secondary | ICD-10-CM | POA: Diagnosis not present

## 2020-01-15 DIAGNOSIS — R7989 Other specified abnormal findings of blood chemistry: Secondary | ICD-10-CM | POA: Diagnosis not present

## 2020-01-15 DIAGNOSIS — M5126 Other intervertebral disc displacement, lumbar region: Secondary | ICD-10-CM | POA: Diagnosis not present

## 2020-01-15 DIAGNOSIS — G4733 Obstructive sleep apnea (adult) (pediatric): Secondary | ICD-10-CM | POA: Diagnosis not present

## 2020-01-15 DIAGNOSIS — E1165 Type 2 diabetes mellitus with hyperglycemia: Secondary | ICD-10-CM | POA: Diagnosis not present

## 2020-01-22 ENCOUNTER — Institutional Professional Consult (permissible substitution): Payer: Federal, State, Local not specified - PPO | Admitting: Pulmonary Disease

## 2020-03-01 ENCOUNTER — Telehealth: Payer: Self-pay | Admitting: "Endocrinology

## 2020-03-01 ENCOUNTER — Other Ambulatory Visit (HOSPITAL_COMMUNITY): Payer: Self-pay | Admitting: Physician Assistant

## 2020-03-01 DIAGNOSIS — I209 Angina pectoris, unspecified: Secondary | ICD-10-CM | POA: Diagnosis not present

## 2020-03-01 DIAGNOSIS — M25561 Pain in right knee: Secondary | ICD-10-CM | POA: Diagnosis not present

## 2020-03-01 DIAGNOSIS — Z6841 Body Mass Index (BMI) 40.0 and over, adult: Secondary | ICD-10-CM | POA: Diagnosis not present

## 2020-03-01 DIAGNOSIS — M545 Low back pain: Secondary | ICD-10-CM | POA: Diagnosis not present

## 2020-03-01 NOTE — Telephone Encounter (Signed)
Pt's wife called and left a VM that she needs clarification on what dosage he is suppose to be taking on his ozempic. She said the pharmacy is confusing her

## 2020-03-01 NOTE — Telephone Encounter (Signed)
Called pt's wife, clarified the dose pt is supposed to take. Understanding voiced.

## 2020-03-18 DIAGNOSIS — M545 Low back pain: Secondary | ICD-10-CM | POA: Diagnosis not present

## 2020-03-18 DIAGNOSIS — M5127 Other intervertebral disc displacement, lumbosacral region: Secondary | ICD-10-CM | POA: Diagnosis not present

## 2020-03-19 ENCOUNTER — Other Ambulatory Visit: Payer: Self-pay

## 2020-03-19 ENCOUNTER — Ambulatory Visit (INDEPENDENT_AMBULATORY_CARE_PROVIDER_SITE_OTHER): Payer: Federal, State, Local not specified - PPO | Admitting: Cardiovascular Disease

## 2020-03-19 ENCOUNTER — Encounter: Payer: Self-pay | Admitting: *Deleted

## 2020-03-19 ENCOUNTER — Encounter: Payer: Self-pay | Admitting: Cardiovascular Disease

## 2020-03-19 VITALS — BP 144/80 | HR 86 | Ht 72.0 in | Wt 340.0 lb

## 2020-03-19 DIAGNOSIS — R0789 Other chest pain: Secondary | ICD-10-CM

## 2020-03-19 DIAGNOSIS — E119 Type 2 diabetes mellitus without complications: Secondary | ICD-10-CM | POA: Diagnosis not present

## 2020-03-19 DIAGNOSIS — R030 Elevated blood-pressure reading, without diagnosis of hypertension: Secondary | ICD-10-CM | POA: Diagnosis not present

## 2020-03-19 DIAGNOSIS — Z01812 Encounter for preprocedural laboratory examination: Secondary | ICD-10-CM

## 2020-03-19 MED ORDER — METOPROLOL TARTRATE 100 MG PO TABS: 100.0000 mg | ORAL_TABLET | Freq: Once | ORAL | 0 refills | Status: DC

## 2020-03-19 NOTE — Addendum Note (Signed)
Addended by: Lesle Chris on: 03/19/2020 10:00 AM   Modules accepted: Orders

## 2020-03-19 NOTE — Patient Instructions (Addendum)
Medication Instructions:  Continue all current medications.  Labwork:  BMET - order given today.   Please do 2-3 days prior to CT  Testing/Procedures:  Your physician has requested that you have cardiac CT. Cardiac computed tomography (CT) is a painless test that uses an x-ray machine to take clear, detailed pictures of your heart. For further information please visit [email or web address] Please follow instruction sheet as given.  Office will contact with results via phone or letter.    Follow-Up: 3 months   Any Other Special Instructions Will Be Listed Below (If Applicable).  If you need a refill on your cardiac medications before your next appointment, please call your pharmacy.

## 2020-03-19 NOTE — Progress Notes (Signed)
CARDIOLOGY CONSULT NOTE  Patient ID: David Hammond MRN: [id number] DOB/AGE: 1961-03-12 59 y.o.  Admit date: (Not on file) Primary Physician: Cory Munch, PA-C  Reason for Consultation: Chest pain  HPI: David Hammond is a 59 y.o. male who is being seen today for the evaluation of chest pain at the request of Sharilyn Sites, MD.   I reviewed notes from his PCP.  Past medical history includes type 2 diabetes mellitus and obesity.  He [place] and has been staying very active.  He has had 2 episodes of dull, retrosternal chest pain in the past 2 months.  He initially wondered if it could be related to GERD.  He tried some deep coughing to see if this would alleviate his symptoms.  He has lost nearly 50 pounds by maintaining a keto diet and staying very active on the farm.  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  He spent 12 years in [place] and [place].  He tore both Achilles tendons and had them reattached at the [facility] several years ago.  He has also been limited by lumbar pain.  He likes drinking bourbon and smoking a pipe.  He had normal ABIs on 11/10/2019.  ECG performed today which I personally view demonstrates sinus rhythm with nonspecific intraventricular conduction delay.  Family history: His father is 2 years old.  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  His father has a history of CABG and first developed coronary disease in his 37s.  He was a smoker.    Allergies  Allergen Reactions  . Trazodone Palpitations and Shortness Of Breath    Current Outpatient Medications  Medication Sig Dispense Refill  . celecoxib (CELEBREX) 200 MG capsule Take 200 mg by mouth 2 (two) times daily.    . metFORMIN (GLUCOPHAGE) 1000 MG tablet Take 1 tablet (1,000 mg total) by mouth 2 (two) times daily with a meal. 180 tablet 1  . Needles & Syringes MISC     . Needles & Syringes MISC     . omeprazole (PRILOSEC) 20 MG capsule Take 1  capsule (20 mg total) by mouth daily. 90 capsule 2  . Semaglutide, 1 MG/DOSE, (OZEMPIC, 1 MG/DOSE,) 2 MG/1.5ML SOPN Inject 1 mg into the skin once a week. 4 pen 3  . SYRINGE/NEEDLE, DISP, 1 ML 23G X 1" 1 ML MISC Use to administer testosterone 50 each 2  . Syringe/Needle, Disp, 18G X 1" 1 ML MISC Use to draw up testosterone 50 each 2  . testosterone cypionate (DEPOTESTOSTERONE CYPIONATE) 200 MG/ML injection Inject 0.89mL into muscle every 2 weeks. Use 18 gauge to draw up and 23 gauge to administer. 10 mL 0  . tiZANidine (ZANAFLEX) 4 MG capsule Take 4 mg by mouth 3 (three) times daily as needed for muscle spasms.     No current facility-administered medications for this visit.    Past Medical History:  Diagnosis Date  . Allergy 2016   Trazodone  . Arthritis 1967   Knees  . Diabetes mellitus without complication ([place]) 6283  . GERD (gastroesophageal reflux disease) 2015  . Sleep apnea 2015   Controlled with Tx CPAP    Past Surgical History:  Procedure Laterality Date  . HERNIA REPAIR  6629   Umbilical    Social History   Socioeconomic History  . Marital status: Married    Spouse name: Not on file  . Number of children: Not on file  .  Years of education: Not on file  . Highest education level: Not on file  Occupational History  . Not on file  Tobacco Use  . Smoking status: Light Tobacco Smoker    Packs/day: 0.00    Years: 40.00    Pack years: 0.00    Types: Pipe  . Smokeless tobacco: Never Used  . Tobacco comment: 1-2 pipes per day. Used to smoke cigarettes. Quit about 2015. Use nicotine replacement gum/mints.  Substance and Sexual Activity  . Alcohol use: Yes  . Drug use: Never  . Sexual activity: Yes    Partners: Female    Birth control/protection: None    Comment: Vasectomy  Other Topics Concern  . Not on file  Social History Narrative  . Not on file   Social Determinants of Health   Financial Resource Strain: Low Risk   . Difficulty of Paying Living  Expenses: Not hard at all  Food Insecurity: No Food Insecurity  . Worried About Programme researcher, broadcasting/film/video in the Last Year: Never true  . Ran Out of Food in the Last Year: Never true  Transportation Needs: Unknown  . Lack of Transportation (Medical): Not on file  . Lack of Transportation (Non-Medical): No  Physical Activity: Unknown  . Days of Exercise per Week: 7 days  . Minutes of Exercise per Session: Not on file  Stress: No Stress Concern Present  . Feeling of Stress : Only a little  Social Connections: Unknown  . Frequency of Communication with Friends and Family: Three times a week  . Frequency of Social Gatherings with Friends and Family: Twice a week  . Attends Religious Services: Not on file  . Active Member of Clubs or Organizations: Not on file  . Attends Banker Meetings: Not on file  . Marital Status: Married  Catering manager Violence: Not At Risk  . Fear of Current or Ex-Partner: No  . Emotionally Abused: No  . Physically Abused: No  . Sexually Abused: No      Current Meds  Medication Sig  . celecoxib (CELEBREX) 200 MG capsule Take 200 mg by mouth 2 (two) times daily.  . metFORMIN (GLUCOPHAGE) 1000 MG tablet Take 1 tablet (1,000 mg total) by mouth 2 (two) times daily with a meal.  . Needles & Syringes MISC   . Needles & Syringes MISC   . omeprazole (PRILOSEC) 20 MG capsule Take 1 capsule (20 mg total) by mouth daily.  . Semaglutide, 1 MG/DOSE, (OZEMPIC, 1 MG/DOSE,) 2 MG/1.5ML SOPN Inject 1 mg into the skin once a week.  . SYRINGE/NEEDLE, DISP, 1 ML 23G X 1" 1 ML MISC Use to administer testosterone  . Syringe/Needle, Disp, 18G X 1" 1 ML MISC Use to draw up testosterone  . testosterone cypionate (DEPOTESTOSTERONE CYPIONATE) 200 MG/ML injection Inject 0.54mL into muscle every 2 weeks. Use 18 gauge to draw up and 23 gauge to administer.  Marland Kitchen tiZANidine (ZANAFLEX) 4 MG capsule Take 4 mg by mouth 3 (three) times daily as needed for muscle spasms.      Review of  systems complete and found to be negative unless listed above in HPI    Physical exam Blood pressure (!) 144/80, pulse 86, height 6' (1.829 m), weight (!) 340 lb (154.2 kg), SpO2 98 %. General: Obese male in NAD Neck: No JVD, no thyromegaly or thyroid nodule.  Lungs: Clear to auscultation bilaterally with normal respiratory effort. CV: Nondisplaced PMI. Regular rate and rhythm, normal S1/S2, no S3/S4, no murmur.  No peripheral edema.  No carotid bruit.    Abdomen: Firm, obese.  Skin: Intact without lesions or rashes.  Neurologic: Alert and oriented x 3.  Psych: Normal affect. Extremities: No clubbing or cyanosis.  HEENT: Normal.   ECG: Most recent ECG reviewed.   Labs: Lab Results  Component Value Date/Time   K 4.5 12/26/2019 07:46 AM   BUN 17 12/26/2019 07:46 AM   CREATININE 0.87 12/26/2019 07:46 AM   ALT 57 (H) 12/26/2019 07:46 AM   HGB 17.4 (H) 12/26/2019 07:46 AM     Lipids: No results found for: LDLCALC, LDLDIRECT, CHOL, TRIG, HDL      ASSESSMENT AND PLAN:   1.  Chest pain: He has had 2 episodes of dull retrosternal chest pain in the past 2 months.  Cardiovascular risk factors include morbid obesity, family history of premature coronary artery disease, and type 2 diabetes mellitus. I will proceed with coronary CT angiography as I suspect a Lexiscan Myoview has the potential for significant soft tissue attenuation artifact.  2.  Type 2 diabetes mellitus: Currently on Metformin.  He needs weight loss and has already lost roughly 50 pounds.  3.  Elevated blood pressure: He does not carry diagnosis of hypertension.  This will need further monitoring.   Disposition: Follow up in 3 months  Signed: Prentice Docker, M.D., F.A.C.C.  03/19/2020, 9:12 AM

## 2020-03-25 DIAGNOSIS — H524 Presbyopia: Secondary | ICD-10-CM | POA: Diagnosis not present

## 2020-03-25 DIAGNOSIS — E119 Type 2 diabetes mellitus without complications: Secondary | ICD-10-CM | POA: Diagnosis not present

## 2020-04-14 DIAGNOSIS — Z01812 Encounter for preprocedural laboratory examination: Secondary | ICD-10-CM | POA: Diagnosis not present

## 2020-04-14 DIAGNOSIS — R0789 Other chest pain: Secondary | ICD-10-CM | POA: Diagnosis not present

## 2020-04-15 ENCOUNTER — Telehealth: Payer: Self-pay | Admitting: *Deleted

## 2020-04-15 ENCOUNTER — Telehealth (HOSPITAL_COMMUNITY): Payer: Self-pay | Admitting: Emergency Medicine

## 2020-04-15 LAB — BASIC METABOLIC PANEL
BUN: 19 mg/dL (ref 7–25)
CO2: 25 mmol/L (ref 20–32)
Calcium: 9.3 mg/dL (ref 8.6–10.3)
Chloride: 102 mmol/L (ref 98–110)
Creat: 0.98 mg/dL (ref 0.70–1.33)
Glucose, Bld: 174 mg/dL — ABNORMAL HIGH (ref 65–99)
Potassium: 4.2 mmol/L (ref 3.5–5.3)
Sodium: 135 mmol/L (ref 135–146)

## 2020-04-15 NOTE — Telephone Encounter (Signed)
Reaching out to patient to offer assistance regarding upcoming cardiac imaging study; pt verbalizes understanding of appt date/time, parking situation and where to check in, pre-test NPO status and medications ordered, and verified current allergies; name and call back number provided for further questions should they arise Halima Fogal RN Navigator Cardiac Imaging [facility] [phone] office [phone] cell 

## 2020-04-15 NOTE — Telephone Encounter (Signed)
-----   Message from Laqueta Linden, MD sent at 04/15/2020  2:20 PM EDT ----- Blood sugar mildly elevated but normal renal function.

## 2020-04-15 NOTE — Telephone Encounter (Signed)
David Hammond, [place]  06/15/1517 3:43 PM EDT    Patient notified. Copy to pcp.

## 2020-04-16 ENCOUNTER — Other Ambulatory Visit: Payer: Self-pay

## 2020-04-16 ENCOUNTER — Ambulatory Visit (HOSPITAL_COMMUNITY)
Admission: RE | Admit: 2020-04-16 | Discharge: 2020-04-16 | Disposition: A | Discharge status: Home / Self Care | Payer: Federal, State, Local not specified - PPO | Source: Ambulatory Visit | Attending: Cardiovascular Disease | Admitting: Cardiovascular Disease

## 2020-04-16 DIAGNOSIS — R030 Elevated blood-pressure reading, without diagnosis of hypertension: Secondary | ICD-10-CM | POA: Diagnosis not present

## 2020-04-16 DIAGNOSIS — R0789 Other chest pain: Secondary | ICD-10-CM | POA: Diagnosis not present

## 2020-04-16 IMAGING — CT CT HEART MORP W/ CTA COR W/ SCORE W/ CA W/CM &/OR W/O CM
4 of 7 series · 8 of 20 positions shown, 9 images · IV contrast (omnipaque)
Comparison: None.
COMPARISON: None.

Addendum:
EXAM:
OVER-READ INTERPRETATION  CT CHEST

The following report is an over-read performed by radiologist Dr.
Elrizza Tsenae [REDACTED] on 04/16/2020. This
over-read does not include interpretation of cardiac or coronary
anatomy or pathology. The coronary calcium score/coronary CTA
interpretation by the cardiologist is attached.
HISTORY: Chest pain, cardiac cause suspected
Cardiac/Coronary CT
TECHNIQUE: The patient was scanned on a Siemens Force scanner.
PROTOCOL: A 120 kV prospective scan was triggered in the descending thoracic
aorta at 111 HU's. Axial non-contrast 3 mm slices were carried out
through the heart. The data set was analyzed on a dedicated work
station and scored using the Agatson method. Gantry rotation speed
was 250 msecs and collimation was 0.6 mm. Beta blockade and 0.8 mg
of sl NTG was given. The 3D data set was reconstructed in 5%
intervals of 35-75% of the R-R cycle. Diastolic phases were analyzed
on a dedicated work station using MPR, MIP and VRT modes. The
patient received 80mL OMNIPAQUE IOHEXOL 350 MG/ML SOLN of contrast.

[Series 6: best diast 72 % · axial · 0.39mm/px · z∈[+1052,+1090]mm · 2 of 286 slices shown]
[im 96/286  vessel]
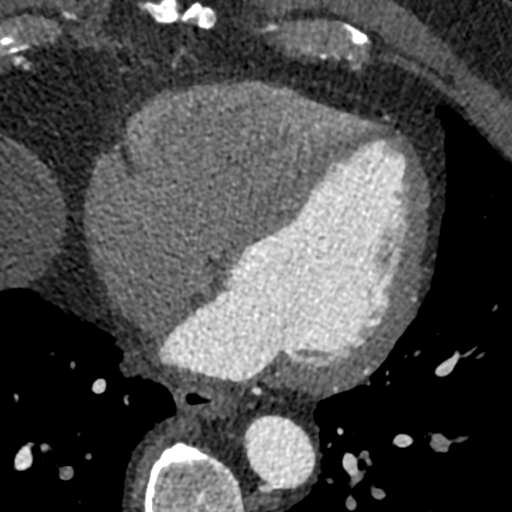
[im 191/286  vessel]
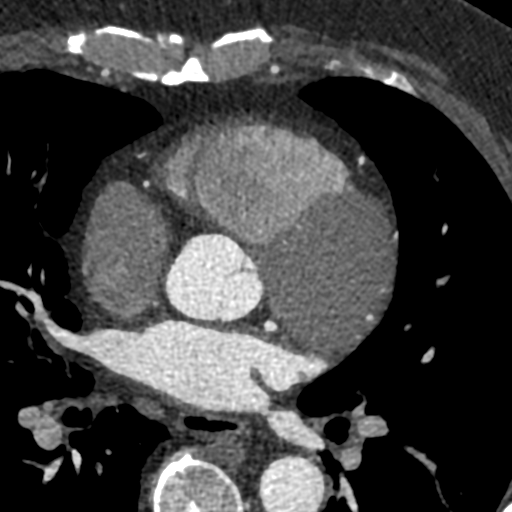

[Series 7: best syst · axial · 0.39mm/px · z∈[+1052,+1090]mm · 2 of 286 slices shown, 3 images]
[im 96/286  vessel]
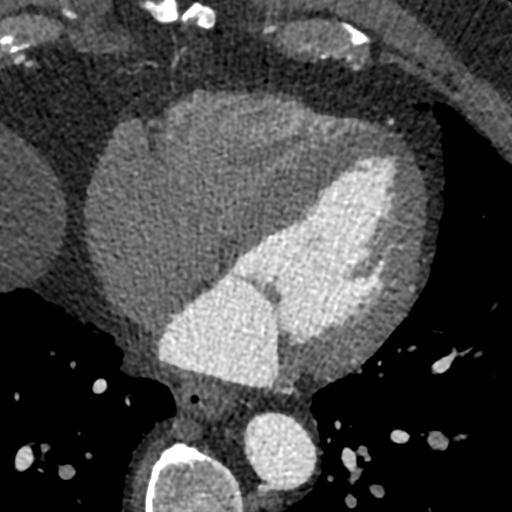
[im 96/286  lung]
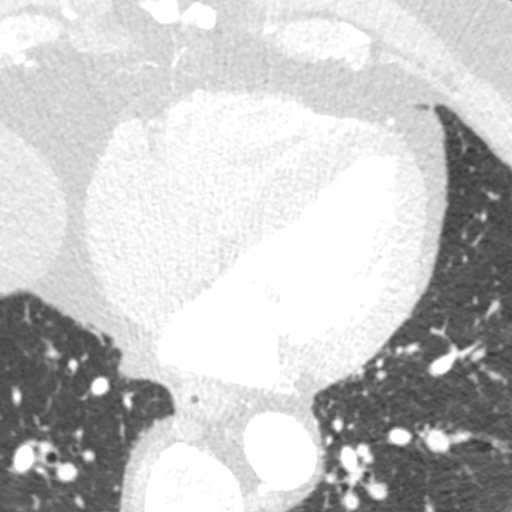
[im 191/286  vessel]
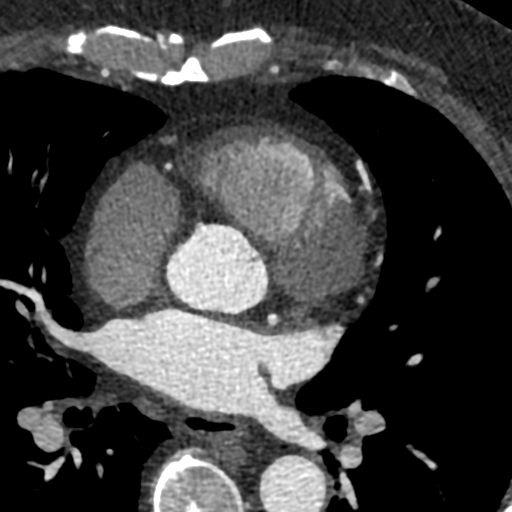

[Series 9: ts diast sharp · axial · 0.39mm/px · z∈[+1052,+1090]mm · 2 of 286 slices shown]
[im 96/286  lung]
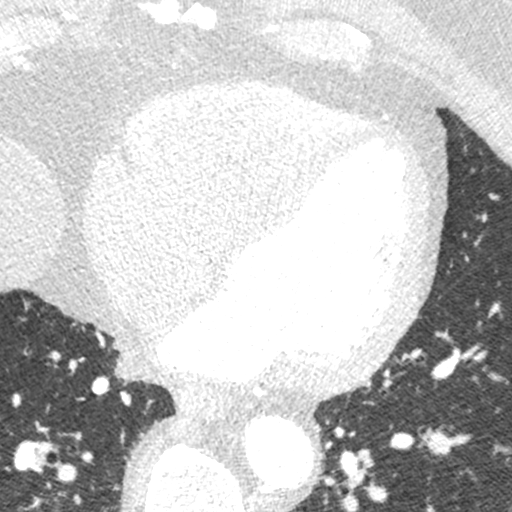
[im 191/286  lung]
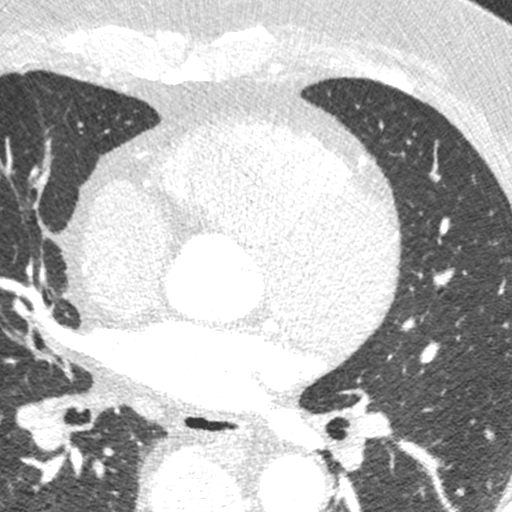

[Series 10: ts syst sharp · axial · 0.39mm/px · z∈[+1052,+1090]mm · 2 of 286 slices shown]
[im 96/286  lung]
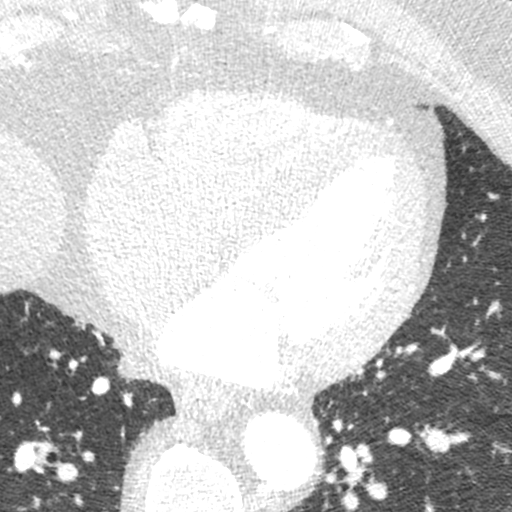
[im 191/286  lung]
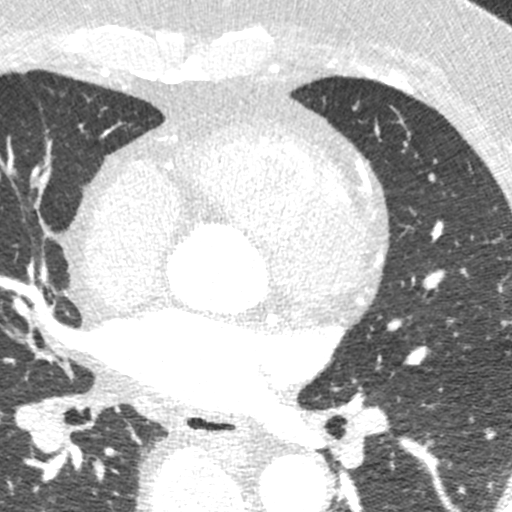

[8 of 20 positions shown; findings below may reference images not displayed]

FINDINGS: Within the visualized portions of the thorax there are no suspicious
appearing pulmonary nodules or masses, there is no acute
consolidative airspace disease, no pleural effusions, no
pneumothorax and no lymphadenopathy. Visualized portions of the
upper abdomen demonstrates diffuse low attenuation throughout the
visualized hepatic parenchyma, indicative of severe hepatic
steatosis. There are no aggressive appearing lytic or blastic
lesions noted in the visualized portions of the skeleton.
IMPRESSION: 1. Severe hepatic steatosis.
FINDINGS: Coronary calcium score: The patient's coronary artery calcium score
is 0, which places the patient in the 0 percentile.

Coronary arteries: Normal coronary origins.  Left dominance.

Right Coronary Artery: Small, nondominant vessel. No significant
plaque or stenosis.

Left Main Coronary Artery: Normal caliber vessel. No significant
plaque or stenosis.

Left Anterior Descending Coronary Artery: Normal caliber vessel. No
significant plaque or stenosis. Gives rise to 1 large D1/ramus and 1
small D2 diagonal branches. Distal LAD wraps apex.

Left Circumflex Artery: Normal caliber vessel, gives rise to PDA. No
significant plaque or stenosis. Gives rise to 1 large and 1 small OM
branches.

Aorta: Normal size, 31 mm at the mid ascending aorta (level of the
PA bifurcation) measured double oblique. No calcifications. No
dissection.

Aortic Valve: No calcifications. Trileaflet.

Other findings:

Normal pulmonary vein drainage into the left atrium.

Normal left atrial appendage without a thrombus.

Normal size of the pulmonary artery.

RA/RV appear mildly enlarged.
IMPRESSION: 1. No evidence of CAD, CADRADS = 0.

2. Coronary calcium score of 0. This was 0 percentile for age and
sex matched control.

3. Normal coronary origin with left dominance.

4. RA/RV appear mildly enlarged, with normal PA size. Recommend
clinical correlation

*** End of Addendum ***
EXAM:
OVER-READ INTERPRETATION  CT CHEST

The following report is an over-read performed by radiologist Dr.
Elrizza Tsenae [REDACTED] on 04/16/2020. This
over-read does not include interpretation of cardiac or coronary
anatomy or pathology. The coronary calcium score/coronary CTA
interpretation by the cardiologist is attached.
FINDINGS: Within the visualized portions of the thorax there are no suspicious
appearing pulmonary nodules or masses, there is no acute
consolidative airspace disease, no pleural effusions, no
pneumothorax and no lymphadenopathy. Visualized portions of the
upper abdomen demonstrates diffuse low attenuation throughout the
visualized hepatic parenchyma, indicative of severe hepatic
steatosis. There are no aggressive appearing lytic or blastic
lesions noted in the visualized portions of the skeleton.
IMPRESSION: 1. Severe hepatic steatosis.

## 2020-04-16 MED ORDER — NITROGLYCERIN 0.4 MG SL SUBL
0.8000 mg | SUBLINGUAL_TABLET | Freq: Once | SUBLINGUAL | Status: AC
  Administered 2020-04-16: 0.8 mg via SUBLINGUAL

## 2020-04-16 MED ORDER — METOPROLOL TARTRATE 5 MG/5ML IV SOLN
INTRAVENOUS | Status: AC
  Filled 2020-04-16: qty 15

## 2020-04-16 MED ORDER — IOHEXOL 350 MG/ML SOLN
80.0000 mL | Freq: Once | INTRAVENOUS | Status: AC | PRN
  Administered 2020-04-16: 08:00:00 80 mL via INTRAVENOUS

## 2020-04-16 MED ORDER — METOPROLOL TARTRATE 5 MG/5ML IV SOLN
5.0000 mg | INTRAVENOUS | Status: DC | PRN
  Administered 2020-04-16 (×2): 5 mg via INTRAVENOUS

## 2020-04-16 MED ORDER — NITROGLYCERIN 0.4 MG SL SUBL
SUBLINGUAL_TABLET | SUBLINGUAL | Status: AC
  Filled 2020-04-16: qty 2

## 2020-04-17 ENCOUNTER — Other Ambulatory Visit: Payer: Self-pay | Admitting: Family Medicine

## 2020-04-19 ENCOUNTER — Telehealth: Payer: Self-pay | Admitting: *Deleted

## 2020-04-19 NOTE — Telephone Encounter (Signed)
Lesle Chris, [place]  3/52/4818 5:90 PM EDT    Patient notified. Copy to pcp.

## 2020-04-19 NOTE — Telephone Encounter (Addendum)
-----   Message from Laqueta Linden, MD sent at 04/19/2020  8:56 AM EDT ----- No evidence for CAD.  Laqueta Linden, MD  04/16/2020 8:59 AM EDT    Await cardiac specific results. Severe hepatic steatosis ("fatty liver") seen. Please forward a copy to PCP so that this can be managed. He will need to see GI.

## 2020-05-14 ENCOUNTER — Ambulatory Visit: Payer: Federal, State, Local not specified - PPO | Admitting: "Endocrinology

## 2020-05-14 DIAGNOSIS — M5136 Other intervertebral disc degeneration, lumbar region: Secondary | ICD-10-CM | POA: Diagnosis not present

## 2020-05-17 ENCOUNTER — Other Ambulatory Visit: Payer: Self-pay | Admitting: Family Medicine

## 2020-06-23 ENCOUNTER — Ambulatory Visit: Payer: Federal, State, Local not specified - PPO | Admitting: Cardiovascular Disease

## 2020-07-05 DIAGNOSIS — L989 Disorder of the skin and subcutaneous tissue, unspecified: Secondary | ICD-10-CM | POA: Diagnosis not present

## 2020-07-05 DIAGNOSIS — E1165 Type 2 diabetes mellitus with hyperglycemia: Secondary | ICD-10-CM | POA: Diagnosis not present

## 2020-07-05 DIAGNOSIS — Z6841 Body Mass Index (BMI) 40.0 and over, adult: Secondary | ICD-10-CM | POA: Diagnosis not present

## 2020-08-08 ENCOUNTER — Telehealth (HOSPITAL_COMMUNITY): Payer: Self-pay | Admitting: Physician Assistant

## 2020-08-08 NOTE — Telephone Encounter (Signed)
Called to discuss with patient about Covid symptoms and the use of casirivimab/imdevimab, a monoclonal antibody infusion for those with mild to moderate Covid symptoms and at a high risk of hospitalization.  Pt is qualified for this infusion at the [place] [facility] due to; Specific high risk criteria : BMI > 25   Message left to call back our hotline [phone] and sent my chart message.  Jesilyn Easom PA-C  [facility]    

## 2020-08-09 DIAGNOSIS — J22 Unspecified acute lower respiratory infection: Secondary | ICD-10-CM | POA: Diagnosis not present

## 2020-08-10 ENCOUNTER — Encounter: Payer: Self-pay | Admitting: Nurse Practitioner

## 2020-08-10 ENCOUNTER — Telehealth (HOSPITAL_COMMUNITY): Payer: Self-pay | Admitting: Nurse Practitioner

## 2020-08-10 ENCOUNTER — Other Ambulatory Visit: Payer: Self-pay | Admitting: Nurse Practitioner

## 2020-08-10 DIAGNOSIS — U071 COVID-19: Secondary | ICD-10-CM

## 2020-08-10 DIAGNOSIS — Z20822 Contact with and (suspected) exposure to covid-19: Secondary | ICD-10-CM | POA: Diagnosis not present

## 2020-08-10 DIAGNOSIS — E119 Type 2 diabetes mellitus without complications: Secondary | ICD-10-CM | POA: Diagnosis not present

## 2020-08-10 NOTE — Telephone Encounter (Signed)
Patient returned call to discuss MAB treatment for covid 19 infection. His 66 week old child has been treated and discharged from hospital for covid infection. He and his wife are symptomatic. Neither have been tested so far. Assisted with getting testing appointment and he will call back with results to set up appointment for MAB.

## 2020-08-10 NOTE — Progress Notes (Signed)
I connected by phone with [name] on 08/10/2020 at 3:37 PM to discuss the potential use of an new treatment for mild to moderate COVID-19 viral infection in non-hospitalized patients.  This patient is a 59 y.o. male that meets the FDA criteria for Emergency Use Authorization of casirivimab\imdevimab.  Has a (+) direct SARS-CoV-2 viral test result  Has mild or moderate COVID-19   Is ? 58 years of age and weighs ? 40 kg  Is NOT hospitalized due to COVID-19  Is NOT requiring oxygen therapy or requiring an increase in baseline oxygen flow rate due to COVID-19  Is within 10 days of symptom onset  Has at least one of the high risk factor(s) for progression to severe COVID-19 and/or hospitalization as defined in EUA.  Specific high risk criteria : BMI > 25, diabetes   I have spoken and communicated the following to the patient or parent/caregiver:  1. FDA has authorized the emergency use of casirivimab\imdevimab for the treatment of mild to moderate COVID-19 in adults and pediatric patients with positive results of direct SARS-CoV-2 viral testing who are 90 years of age and older weighing at least 40 kg, and who are at high risk for progressing to severe COVID-19 and/or hospitalization.  2. The significant known and potential risks and benefits of casirivimab\imdevimab, and the extent to which such potential risks and benefits are unknown.  3. Information on available alternative treatments and the risks and benefits of those alternatives, including clinical trials.  4. Patients treated with casirivimab\imdevimab should continue to self-isolate and use infection control measures (e.g., wear mask, isolate, social distance, avoid sharing personal items, clean and disinfect "high touch" surfaces, and frequent handwashing) according to CDC guidelines.   5. The patient or parent/caregiver has the option to accept or refuse casirivimab\imdevimab .  After reviewing this information with the  patient, The patient agreed to proceed with receiving casirivimab\imdevimab infusion and will be provided a copy of the Fact sheet prior to receiving the infusion.Consuello Masse, DNP, AGNP-C [phone] (Infusion Center Hotline)

## 2020-08-11 ENCOUNTER — Ambulatory Visit (HOSPITAL_COMMUNITY)
Admission: RE | Admit: 2020-08-11 | Discharge: 2020-08-11 | Disposition: A | Discharge status: Home / Self Care | Payer: Federal, State, Local not specified - PPO | Source: Ambulatory Visit | Attending: Pulmonary Disease | Admitting: Pulmonary Disease

## 2020-08-11 DIAGNOSIS — U071 COVID-19: Secondary | ICD-10-CM | POA: Diagnosis not present

## 2020-08-11 MED ORDER — DIPHENHYDRAMINE HCL 50 MG/ML IJ SOLN: 50.0000 mg | Freq: Once | INTRAMUSCULAR | Status: DC | PRN

## 2020-08-11 MED ORDER — FAMOTIDINE IN NACL 20-0.9 MG/50ML-% IV SOLN: 20.0000 mg | Freq: Once | INTRAVENOUS | Status: DC | PRN

## 2020-08-11 MED ORDER — SODIUM CHLORIDE 0.9 % IV SOLN
1200.0000 mg | Freq: Once | INTRAVENOUS | Status: AC
  Administered 2020-08-11: 1200 mg via INTRAVENOUS

## 2020-08-11 MED ORDER — SODIUM CHLORIDE 0.9 % IV SOLN: INTRAVENOUS | Status: DC | PRN

## 2020-08-11 MED ORDER — ALBUTEROL SULFATE HFA 108 (90 BASE) MCG/ACT IN AERS: 2.0000 | INHALATION_SPRAY | Freq: Once | RESPIRATORY_TRACT | Status: DC | PRN

## 2020-08-11 MED ORDER — METHYLPREDNISOLONE SODIUM SUCC 125 MG IJ SOLR: 125.0000 mg | Freq: Once | INTRAMUSCULAR | Status: DC | PRN

## 2020-08-11 MED ORDER — EPINEPHRINE 0.3 MG/0.3ML IJ SOAJ: 0.3000 mg | Freq: Once | INTRAMUSCULAR | Status: DC | PRN

## 2020-08-11 NOTE — Discharge Instructions (Signed)

## 2020-08-11 NOTE — Progress Notes (Signed)
°  Diagnosis: COVID-19 ° °Physician: Dr Patrick Wright ° °Procedure: [facility] Med: casirivimab\imdevimab infusion - Provided patient with casirivimab\imdevimab fact sheet for patients, parents and caregivers prior to infusion. ° °Complications: No immediate complications noted. ° °Discharge: Discharged home  ° °Laela Deviney L °08/11/2020 ° ° °

## 2020-08-11 NOTE — Progress Notes (Signed)
  Diagnosis: COVID-19  Physician: Patrick Wright, MD  Procedure: [facility]fusion [facility] Med: casirivimab\imdevimab infusion - Provided patient with casirivimab\imdevimab fact sheet for patients, parents and caregivers prior to infusion.  Complications: No immediate complications noted.  Discharge: Discharged home   Ceirra Belli N Shylee Durrett 08/11/2020  

## 2020-08-25 ENCOUNTER — Ambulatory Visit (HOSPITAL_COMMUNITY)
Admission: RE | Admit: 2020-08-25 | Discharge: 2020-08-25 | Disposition: A | Discharge status: Home / Self Care | Payer: Federal, State, Local not specified - PPO | Source: Ambulatory Visit | Attending: Physician Assistant | Admitting: Physician Assistant

## 2020-08-25 ENCOUNTER — Other Ambulatory Visit (HOSPITAL_COMMUNITY): Payer: Self-pay | Admitting: Physician Assistant

## 2020-08-25 ENCOUNTER — Other Ambulatory Visit: Payer: Self-pay

## 2020-08-25 DIAGNOSIS — R918 Other nonspecific abnormal finding of lung field: Secondary | ICD-10-CM | POA: Diagnosis not present

## 2020-08-25 DIAGNOSIS — J22 Unspecified acute lower respiratory infection: Secondary | ICD-10-CM | POA: Insufficient documentation

## 2020-08-25 DIAGNOSIS — E538 Deficiency of other specified B group vitamins: Secondary | ICD-10-CM | POA: Diagnosis not present

## 2020-08-25 DIAGNOSIS — J189 Pneumonia, unspecified organism: Secondary | ICD-10-CM | POA: Diagnosis not present

## 2020-08-25 DIAGNOSIS — R0602 Shortness of breath: Secondary | ICD-10-CM | POA: Diagnosis not present

## 2020-11-25 IMAGING — DX DG CHEST 2V
2 series · 2 of 2 positions shown · non-contrast
Comparison: 06/03/2019

CLINICAL DATA: Short of breath.  BM0RY-LJ positive

EXAM:
CHEST - 2 VIEW

[chest pa]
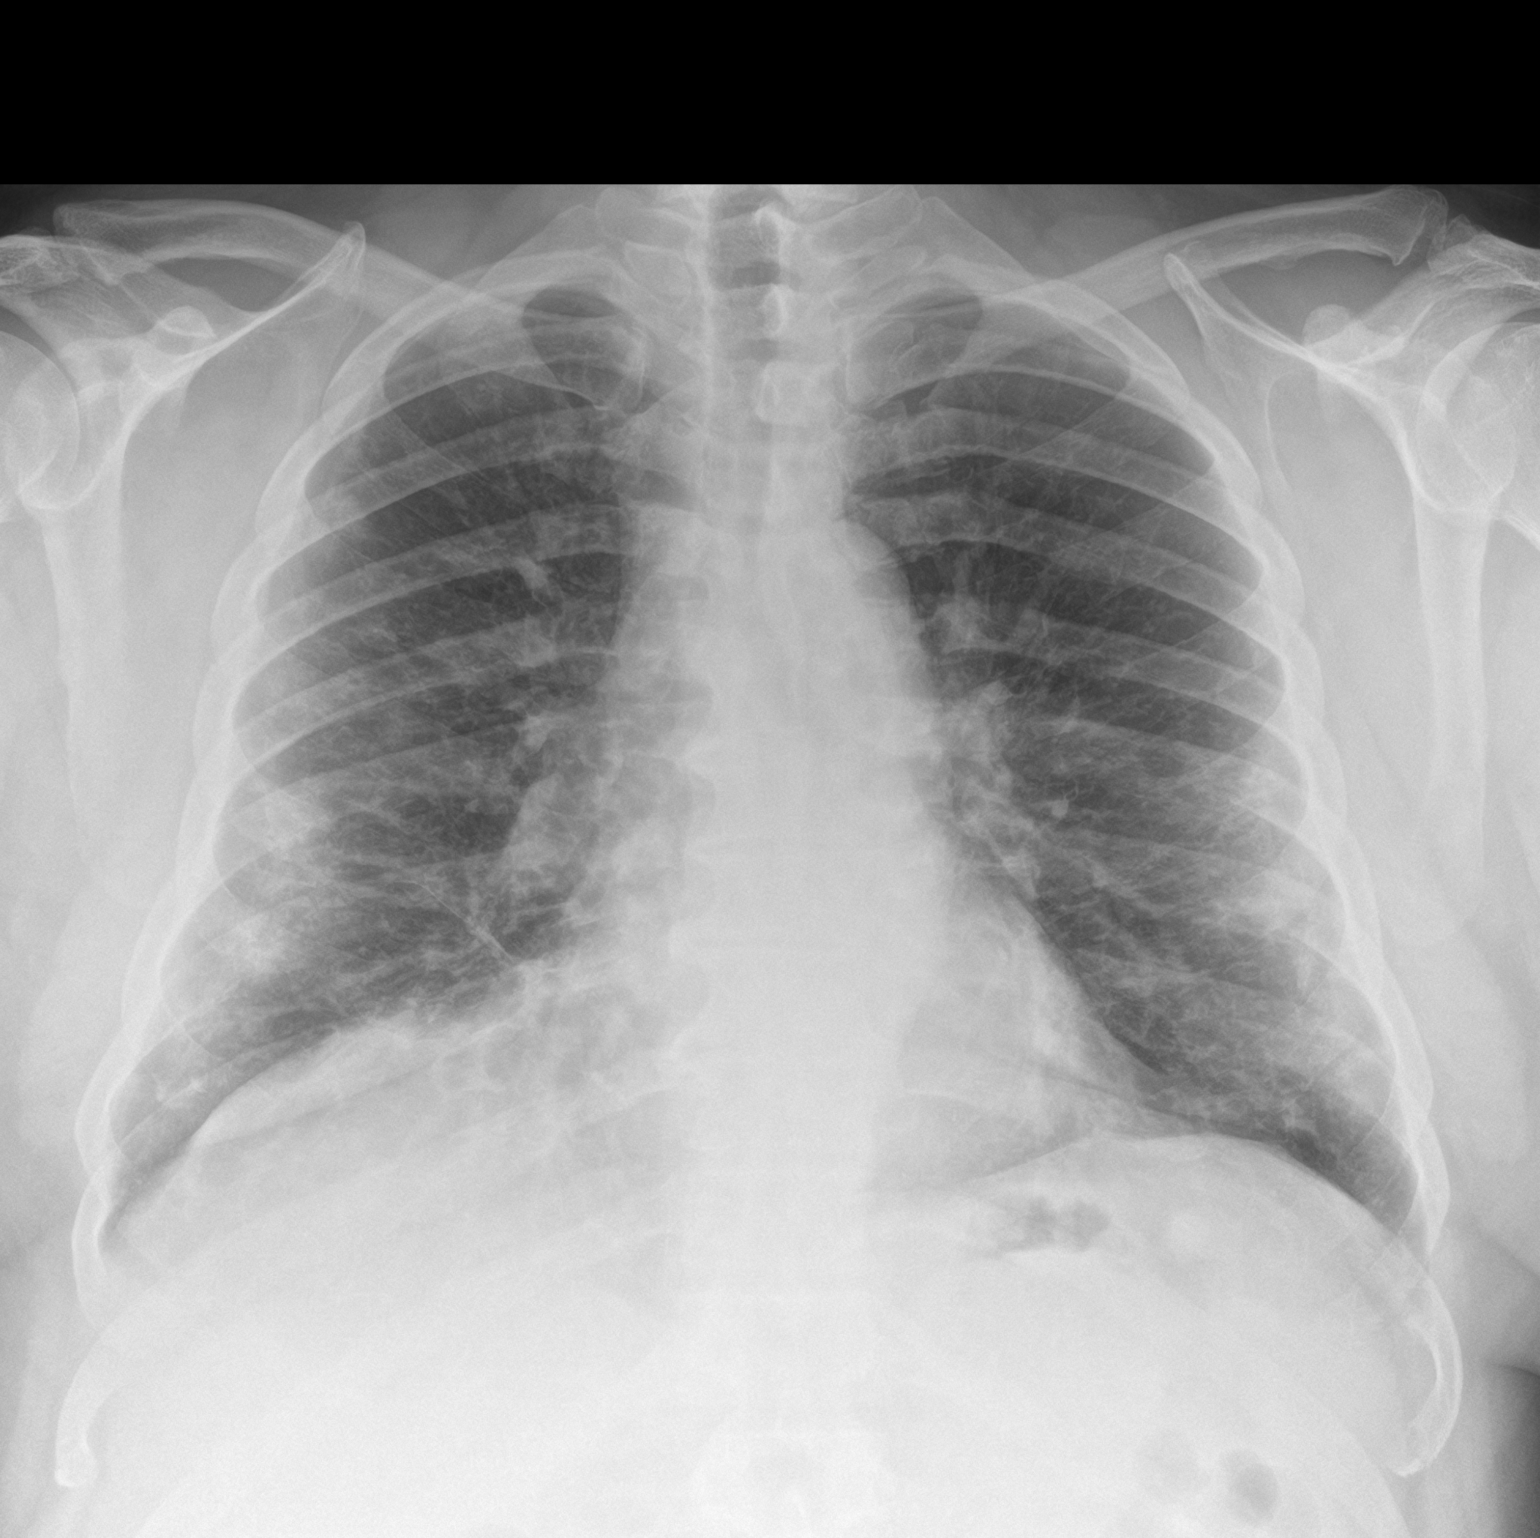

[chest lat]
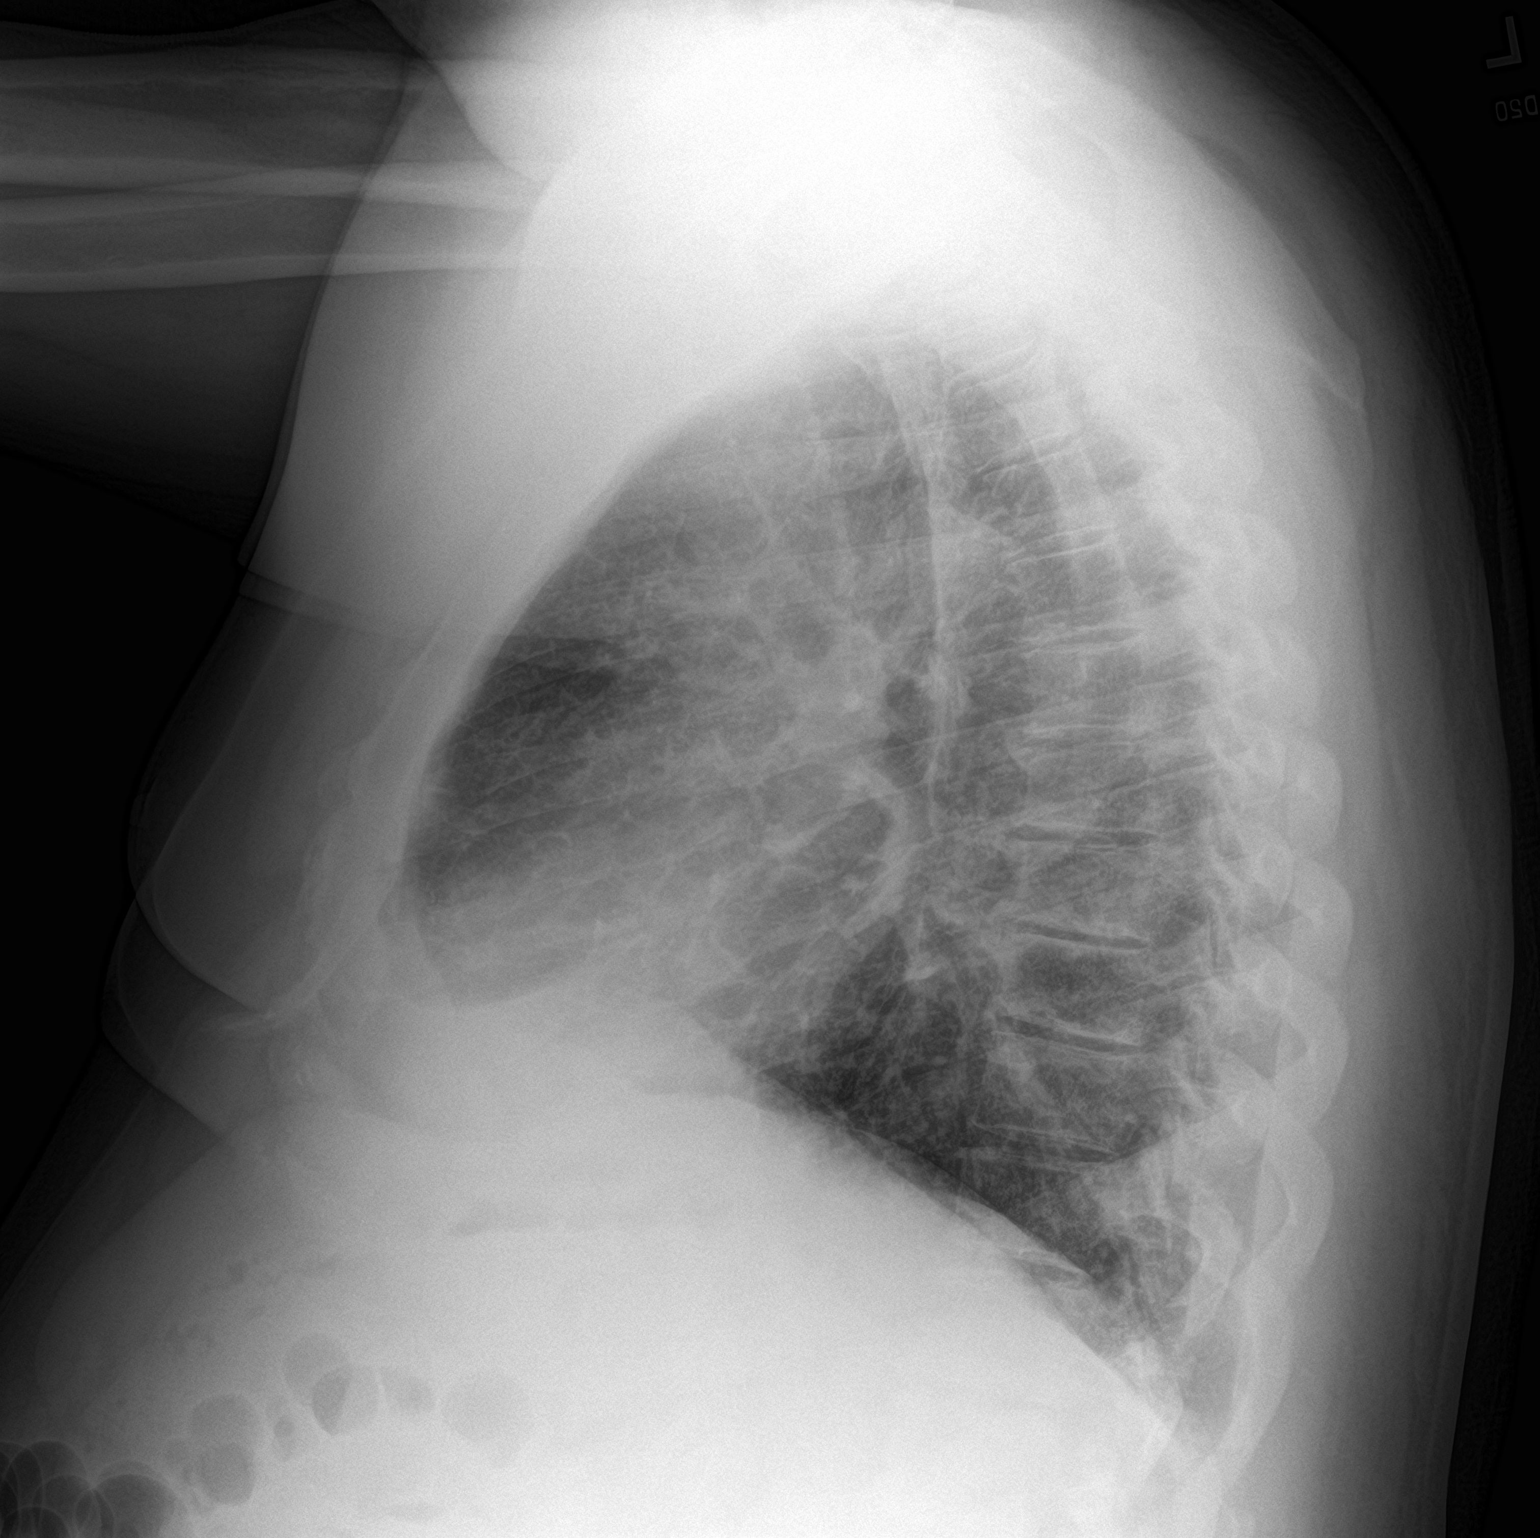

[2 of 2 positions shown; findings below may reference images not displayed]

FINDINGS: Patchy peripheral infiltrates are present both lung bases which are
new. Mild patchy airspace disease right upper lobe. Mild
hyperinflation lungs. No pleural effusion. Heart size and
vascularity normal.
IMPRESSION: Bilateral peripheral bilateral infiltrates likely due to BM0RY-LJ
pneumonia.

## 2021-01-05 DIAGNOSIS — Z6841 Body Mass Index (BMI) 40.0 and over, adult: Secondary | ICD-10-CM | POA: Diagnosis not present

## 2021-01-05 DIAGNOSIS — Z0000 Encounter for general adult medical examination without abnormal findings: Secondary | ICD-10-CM | POA: Diagnosis not present

## 2021-01-05 DIAGNOSIS — E119 Type 2 diabetes mellitus without complications: Secondary | ICD-10-CM | POA: Diagnosis not present

## 2021-01-05 DIAGNOSIS — E1165 Type 2 diabetes mellitus with hyperglycemia: Secondary | ICD-10-CM | POA: Diagnosis not present

## 2021-04-04 DIAGNOSIS — B078 Other viral warts: Secondary | ICD-10-CM | POA: Diagnosis not present

## 2021-04-04 DIAGNOSIS — D225 Melanocytic nevi of trunk: Secondary | ICD-10-CM | POA: Diagnosis not present

## 2021-04-04 DIAGNOSIS — Z1283 Encounter for screening for malignant neoplasm of skin: Secondary | ICD-10-CM | POA: Diagnosis not present

## 2021-05-05 DIAGNOSIS — J069 Acute upper respiratory infection, unspecified: Secondary | ICD-10-CM | POA: Diagnosis not present

## 2021-05-05 DIAGNOSIS — Z681 Body mass index (BMI) 19 or less, adult: Secondary | ICD-10-CM | POA: Diagnosis not present

## 2021-05-05 DIAGNOSIS — E1165 Type 2 diabetes mellitus with hyperglycemia: Secondary | ICD-10-CM | POA: Diagnosis not present

## 2021-05-19 DIAGNOSIS — G4733 Obstructive sleep apnea (adult) (pediatric): Secondary | ICD-10-CM | POA: Diagnosis not present

## 2021-07-27 DIAGNOSIS — I10 Essential (primary) hypertension: Secondary | ICD-10-CM | POA: Diagnosis not present

## 2021-07-27 DIAGNOSIS — Z6841 Body Mass Index (BMI) 40.0 and over, adult: Secondary | ICD-10-CM | POA: Diagnosis not present

## 2021-07-27 DIAGNOSIS — B349 Viral infection, unspecified: Secondary | ICD-10-CM | POA: Diagnosis not present

## 2021-07-27 DIAGNOSIS — E1140 Type 2 diabetes mellitus with diabetic neuropathy, unspecified: Secondary | ICD-10-CM | POA: Diagnosis not present

## 2021-08-25 DIAGNOSIS — Z6841 Body Mass Index (BMI) 40.0 and over, adult: Secondary | ICD-10-CM | POA: Diagnosis not present

## 2021-08-25 DIAGNOSIS — R5383 Other fatigue: Secondary | ICD-10-CM | POA: Diagnosis not present

## 2021-08-25 DIAGNOSIS — R197 Diarrhea, unspecified: Secondary | ICD-10-CM | POA: Diagnosis not present

## 2021-08-25 DIAGNOSIS — R1110 Vomiting, unspecified: Secondary | ICD-10-CM | POA: Diagnosis not present

## 2021-08-25 DIAGNOSIS — R0602 Shortness of breath: Secondary | ICD-10-CM | POA: Diagnosis not present

## 2021-08-25 DIAGNOSIS — R509 Fever, unspecified: Secondary | ICD-10-CM | POA: Diagnosis not present

## 2021-08-25 DIAGNOSIS — R748 Abnormal levels of other serum enzymes: Secondary | ICD-10-CM | POA: Diagnosis not present

## 2021-09-21 ENCOUNTER — Encounter: Payer: Self-pay | Admitting: Internal Medicine

## 2021-12-22 DIAGNOSIS — J22 Unspecified acute lower respiratory infection: Secondary | ICD-10-CM | POA: Diagnosis not present

## 2021-12-23 DIAGNOSIS — R748 Abnormal levels of other serum enzymes: Secondary | ICD-10-CM | POA: Diagnosis not present

## 2021-12-23 DIAGNOSIS — E1165 Type 2 diabetes mellitus with hyperglycemia: Secondary | ICD-10-CM | POA: Diagnosis not present

## 2021-12-23 DIAGNOSIS — Z0000 Encounter for general adult medical examination without abnormal findings: Secondary | ICD-10-CM | POA: Diagnosis not present

## 2022-01-04 ENCOUNTER — Other Ambulatory Visit (HOSPITAL_COMMUNITY): Payer: Self-pay | Admitting: Physician Assistant

## 2022-01-04 DIAGNOSIS — J22 Unspecified acute lower respiratory infection: Secondary | ICD-10-CM

## 2022-02-13 ENCOUNTER — Ambulatory Visit: Payer: Federal, State, Local not specified - PPO | Admitting: Gastroenterology

## 2022-02-15 DIAGNOSIS — Z1331 Encounter for screening for depression: Secondary | ICD-10-CM | POA: Diagnosis not present

## 2022-02-15 DIAGNOSIS — Z0001 Encounter for general adult medical examination with abnormal findings: Secondary | ICD-10-CM | POA: Diagnosis not present

## 2022-02-15 DIAGNOSIS — G4733 Obstructive sleep apnea (adult) (pediatric): Secondary | ICD-10-CM | POA: Diagnosis not present

## 2022-02-15 DIAGNOSIS — Z6841 Body Mass Index (BMI) 40.0 and over, adult: Secondary | ICD-10-CM | POA: Diagnosis not present

## 2022-02-15 DIAGNOSIS — E119 Type 2 diabetes mellitus without complications: Secondary | ICD-10-CM | POA: Diagnosis not present

## 2022-05-09 DIAGNOSIS — E119 Type 2 diabetes mellitus without complications: Secondary | ICD-10-CM | POA: Diagnosis not present

## 2022-05-09 DIAGNOSIS — Z0189 Encounter for other specified special examinations: Secondary | ICD-10-CM | POA: Diagnosis not present

## 2022-05-09 DIAGNOSIS — L03012 Cellulitis of left finger: Secondary | ICD-10-CM | POA: Diagnosis not present

## 2022-05-09 DIAGNOSIS — E291 Testicular hypofunction: Secondary | ICD-10-CM | POA: Diagnosis not present

## 2022-07-20 DIAGNOSIS — M792 Neuralgia and neuritis, unspecified: Secondary | ICD-10-CM | POA: Diagnosis not present

## 2022-07-20 DIAGNOSIS — L851 Acquired keratosis [keratoderma] palmaris et plantaris: Secondary | ICD-10-CM | POA: Diagnosis not present

## 2022-07-20 DIAGNOSIS — I739 Peripheral vascular disease, unspecified: Secondary | ICD-10-CM | POA: Diagnosis not present

## 2022-07-20 DIAGNOSIS — M19079 Primary osteoarthritis, unspecified ankle and foot: Secondary | ICD-10-CM | POA: Diagnosis not present

## 2022-08-03 DIAGNOSIS — E119 Type 2 diabetes mellitus without complications: Secondary | ICD-10-CM | POA: Diagnosis not present

## 2022-08-03 DIAGNOSIS — Z0001 Encounter for general adult medical examination with abnormal findings: Secondary | ICD-10-CM | POA: Diagnosis not present

## 2022-08-03 DIAGNOSIS — E1165 Type 2 diabetes mellitus with hyperglycemia: Secondary | ICD-10-CM | POA: Diagnosis not present

## 2022-08-03 DIAGNOSIS — E291 Testicular hypofunction: Secondary | ICD-10-CM | POA: Diagnosis not present

## 2022-08-18 DIAGNOSIS — G4733 Obstructive sleep apnea (adult) (pediatric): Secondary | ICD-10-CM | POA: Diagnosis not present

## 2022-10-11 DIAGNOSIS — J208 Acute bronchitis due to other specified organisms: Secondary | ICD-10-CM | POA: Diagnosis not present

## 2022-10-11 DIAGNOSIS — J22 Unspecified acute lower respiratory infection: Secondary | ICD-10-CM | POA: Diagnosis not present

## 2022-10-11 DIAGNOSIS — R5383 Other fatigue: Secondary | ICD-10-CM | POA: Diagnosis not present

## 2023-01-29 ENCOUNTER — Ambulatory Visit (INDEPENDENT_AMBULATORY_CARE_PROVIDER_SITE_OTHER): Payer: Federal, State, Local not specified - PPO | Admitting: Orthopaedic Surgery

## 2023-01-29 ENCOUNTER — Ambulatory Visit (INDEPENDENT_AMBULATORY_CARE_PROVIDER_SITE_OTHER): Payer: Federal, State, Local not specified - PPO

## 2023-01-29 ENCOUNTER — Other Ambulatory Visit: Payer: Self-pay

## 2023-01-29 ENCOUNTER — Telehealth: Payer: Self-pay

## 2023-01-29 DIAGNOSIS — M25511 Pain in right shoulder: Secondary | ICD-10-CM

## 2023-01-29 MED ORDER — ACETAMINOPHEN-CODEINE 300-30 MG PO TABS: 1.0000 | ORAL_TABLET | Freq: Three times a day (TID) | ORAL | 0 refills | Status: DC | PRN

## 2023-01-29 MED ORDER — TIZANIDINE HCL 4 MG PO CAPS: 4.0000 mg | ORAL_CAPSULE | Freq: Three times a day (TID) | ORAL | 0 refills | Status: DC | PRN

## 2023-01-29 NOTE — Progress Notes (Signed)
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  He is left-hand dominant but he landed hard with that arm and has had shoulder pain and weakness since then.  He cannot lift his shoulder up above his head.  He has never had issues with the shoulder before or injured it.  He is never had surgery on the shoulder.  He is a diabetic and has been working on better blood glucose control.  Examination of his right shoulder shows significant weakness with abduction.  He is using more of his deltoid to abduct the right shoulder and I cannot get his arm above his head.  It is clinically well located.  His external rotation is also diminished and weak.  3 views of the right shoulder show well located shoulder with moderate arthritis of the [employer] joint and there is an osteophyte off of the attachment of the greater tuberosity for the rotator cuff.  Given his profound weakness and clinical exam findings, I am significantly concerned about a rotator cuff tear with his right shoulder.  This warrants an MRI to evaluate for tear of the rotator cuff.  I will send in some Zanaflex and Tylenol 3 while we await this MRI.  He agrees with this treatment plan.  I want him to be careful with using his shoulder but I have advised him not to be in the sling so we can get the shoulder moving in the interim.

## 2023-01-29 NOTE — Telephone Encounter (Signed)
Patients wife called stated that their visit was so fast they forgot to ask if the MRI could be done as urgent. She stated her husband travels for his job and does not feel like they can wait until March for the MRI scan which is when [facility] told them that he would be scheduled as a "routine" MRI. Please call her to advise.

## 2023-01-30 NOTE — Telephone Encounter (Signed)
emailed [facility] iwth [facility] to see if can get pt in sooner as urgent. they will contact pt to schedule appt

## 2023-02-05 ENCOUNTER — Ambulatory Visit (HOSPITAL_COMMUNITY): Payer: Federal, State, Local not specified - PPO

## 2023-02-07 ENCOUNTER — Telehealth: Payer: Self-pay | Admitting: Orthopaedic Surgery

## 2023-02-07 NOTE — Telephone Encounter (Signed)
Called patient left message via mychart to return call to schedule an appointment with Dr. Ninfa Linden for MRI review

## 2023-04-05 ENCOUNTER — Encounter: Payer: Self-pay | Admitting: Family Medicine

## 2023-04-05 ENCOUNTER — Ambulatory Visit: Payer: Federal, State, Local not specified - PPO | Admitting: Family Medicine

## 2023-04-05 VITALS — BP 148/95 | HR 106 | Ht 72.0 in | Wt 312.0 lb

## 2023-04-05 DIAGNOSIS — R030 Elevated blood-pressure reading, without diagnosis of hypertension: Secondary | ICD-10-CM

## 2023-04-05 DIAGNOSIS — Z125 Encounter for screening for malignant neoplasm of prostate: Secondary | ICD-10-CM

## 2023-04-05 DIAGNOSIS — E559 Vitamin D deficiency, unspecified: Secondary | ICD-10-CM

## 2023-04-05 DIAGNOSIS — Z1329 Encounter for screening for other suspected endocrine disorder: Secondary | ICD-10-CM

## 2023-04-05 DIAGNOSIS — E119 Type 2 diabetes mellitus without complications: Secondary | ICD-10-CM

## 2023-04-05 DIAGNOSIS — Z114 Encounter for screening for human immunodeficiency virus [HIV]: Secondary | ICD-10-CM

## 2023-04-05 DIAGNOSIS — Z1211 Encounter for screening for malignant neoplasm of colon: Secondary | ICD-10-CM

## 2023-04-05 DIAGNOSIS — Z7984 Long term (current) use of oral hypoglycemic drugs: Secondary | ICD-10-CM

## 2023-04-05 DIAGNOSIS — E291 Testicular hypofunction: Secondary | ICD-10-CM

## 2023-04-05 DIAGNOSIS — Z13220 Encounter for screening for lipoid disorders: Secondary | ICD-10-CM

## 2023-04-05 DIAGNOSIS — Z1159 Encounter for screening for other viral diseases: Secondary | ICD-10-CM

## 2023-04-05 NOTE — Assessment & Plan Note (Signed)
Vitals:   04/05/23 1117 04/05/23 1118  BP: (!) 147/92 (!) 148/95  Blood pressure not controlled , Patient reported not interested in medication intervention.   Explained non pharmacological interventions such as low salt, DASH diet discussed. Educated on stress reduction and physical activity minimum 150 minutes per week. Discussed signs and symptoms of major cardiovascular event and need to present to the ED. Follow up in 4 weeks or sooner if needed with at home blood pressure reading logs. Patient verbalizes understanding regarding plan of care and all questions answered.

## 2023-04-05 NOTE — Progress Notes (Addendum)
Complete physical exam  Patient: David Hammond   DOB: [birth year]   62 y.o. Male  MRN: [id number]  Subjective:    Chief Complaint  Patient presents with   Establish Care    David Hammond is a 62 y.o. male who presents today for a complete physical exam. He reports consuming a general diet. 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 He generally feels well. He reports sleeping well. He does not have additional problems to discuss today.    Most recent fall risk assessment:    01/14/2020    3:08 PM  Fall Risk   Falls in the past year? 0  Follow up Falls evaluation completed     Most recent depression screenings:    04/05/2023   11:15 AM 12/16/2019    8:16 AM  PHQ 2/9 Scores  PHQ - 2 Score 0 0    Vision:Not within last year  and Dental: No current dental problems and Receives regular dental care  Patient Care Team: Del Newman Nip, Tenna Child, FNP as PCP - General (Family Medicine) Laqueta Linden, MD (Inactive) as PCP - Cardiology (Cardiology) Lanelle Bal, DO as Consulting Physician (Gastroenterology)   Outpatient Medications Prior to Visit  Medication Sig   acetaminophen-codeine (TYLENOL #3) 300-30 MG tablet Take 1-2 tablets by mouth every 8 (eight) hours as needed for moderate pain.   celecoxib (CELEBREX) 200 MG capsule Take 200 mg by mouth 2 (two) times daily.   Needles & Syringes MISC    Needles & Syringes MISC    SYRINGE/NEEDLE, DISP, 1 ML 23G X 1" 1 ML MISC Use to administer testosterone   Syringe/Needle, Disp, 18G X 1" 1 ML MISC Use to draw up testosterone   testosterone cypionate (DEPOTESTOSTERONE CYPIONATE) 200 MG/ML injection Inject 0.56mL into muscle every 2 weeks. Use 18 gauge to draw up and 23 gauge to administer.   TRULICITY 1.5 MG/0.5ML SOPN SMARTSIG:0.5 Milliliter(s) SUB-Q Once a Week   metFORMIN (GLUCOPHAGE) 1000 MG tablet Take 1 tablet (1,000 mg total) by mouth 2 (two) times daily with a meal. (Patient not  taking: Reported on 04/05/2023)   metoprolol tartrate (LOPRESSOR) 100 MG tablet Take 1 tablet (100 mg total) by mouth once for 1 dose. (Patient not taking: Reported on 04/05/2023)   omeprazole (PRILOSEC) 20 MG capsule Take 1 capsule (20 mg total) by mouth daily. (Patient not taking: Reported on 04/05/2023)   Semaglutide, 1 MG/DOSE, (OZEMPIC, 1 MG/DOSE,) 2 MG/1.5ML SOPN Inject 1 mg into the skin once a week. (Patient not taking: Reported on 04/05/2023)   tiZANidine (ZANAFLEX) 4 MG capsule Take 1 capsule (4 mg total) by mouth 3 (three) times daily as needed for muscle spasms. (Patient not taking: Reported on 04/05/2023)   No facility-administered medications prior to visit.    Review of Systems  Constitutional:  Negative for chills and fever.  HENT:  Negative for ear pain.   Eyes:  Negative for blurred vision.  Respiratory:  Negative for shortness of breath.   Cardiovascular:  Negative for chest pain.  Gastrointestinal:  Negative for abdominal pain.  Genitourinary:  Negative for dysuria.  Musculoskeletal:  Positive for myalgias. Negative for back pain.  Skin:  Negative for rash.  Neurological:  Negative for dizziness and headaches.  Psychiatric/Behavioral:  Negative for suicidal ideas. The patient is not nervous/anxious.   All other systems reviewed and are negative.      Objective:    BP (!) 148/95   Pulse Marland Kitchen)  106   Ht 6' (1.829 m)   Wt (!) 312 lb (141.5 kg)   SpO2 95%   BMI 42.31 kg/m  BP Readings from Last 3 Encounters:  04/05/23 (!) 148/95  08/11/20 111/70  04/16/20 118/80      Physical Exam Vitals reviewed.  Constitutional:      General: He is not in acute distress.    Appearance: Normal appearance. He is not ill-appearing, toxic-appearing or diaphoretic.  HENT:     Head: Normocephalic.     Right Ear: Tympanic membrane normal.     Left Ear: Tympanic membrane normal.     Mouth/Throat:     Mouth: Mucous membranes are moist.  Eyes:     General:        Right eye: No  discharge.        Left eye: No discharge.     Conjunctiva/sclera: Conjunctivae normal.  Cardiovascular:     Rate and Rhythm: Normal rate.     Pulses: Normal pulses.     Heart sounds: Normal heart sounds.  Pulmonary:     Effort: Pulmonary effort is normal. No respiratory distress.     Breath sounds: Normal breath sounds.  Abdominal:     General: Bowel sounds are normal.     Palpations: Abdomen is soft.     Tenderness: There is no abdominal tenderness. There is no right CVA tenderness, left CVA tenderness or guarding.  Musculoskeletal:     Right shoulder: No swelling or tenderness. Decreased range of motion.     Left shoulder: No swelling or tenderness. Normal range of motion.     Cervical back: Neck supple. No tenderness.  Skin:    General: Skin is warm and dry.     Capillary Refill: Capillary refill takes less than 2 seconds.  Neurological:     General: No focal deficit present.     Mental Status: He is alert and oriented to person, place, and time.     Coordination: Coordination normal.     Gait: Gait normal.  Psychiatric:        Mood and Affect: Mood normal.        Behavior: Behavior normal.      No results found for any visits on 04/05/23.    Assessment & Plan:    Routine Health Maintenance and Physical Exam  Immunization History  Administered Date(s) Administered   Influenza,inj,Quad PF,6+ Mos 10/15/2019   Tdap 10/15/2019    Health Maintenance  Topic Date Due   COVID-19 Vaccine (1) Never done   OPHTHALMOLOGY EXAM  Never done   HIV Screening  Never done   Hepatitis C Screening  Never done   COLONOSCOPY (Pts 45-50yrs Insurance coverage will need to be confirmed)  Never done   Zoster Vaccines- Shingrix (1 of 2) Never done   HEMOGLOBIN A1C  07/13/2020   Diabetic kidney evaluation - Urine ACR  10/14/2020   Diabetic kidney evaluation - eGFR measurement  04/14/2021   INFLUENZA VACCINE  07/12/2023   FOOT EXAM  04/04/2024   DTaP/Tdap/Td (2 - Td or Tdap) 10/14/2029    HPV VACCINES  Aged Out    Discussed health benefits of physical activity, and encouraged him to engage in regular exercise appropriate for his age and condition.  Type 2 diabetes mellitus without complication, without long-term current use of insulin Assessment & Plan: Patient reported Trulicity 1.5 mg once weekly and has not been taking medication for one month. A1C 8.2 not goal last reading on 01/14/20, Labs  ordered in today's visit. Discussed medication desired effects, potential side effects, and how to administer the medication. Nonpharmacological interventions such as low carb diet,high in vegetables and fruit discussed. Educated on importance of physical activity. Discussed signs and symptoms of hypoglycemia, hyperglycemia and need to present to the ED. Follow up in 3 months or sooner if needed. Patient verbalizes understanding regarding plan of care and all questions answered. Ophthalmology referral placed , Foot exam within desired limits   Orders: -     Microalbumin / creatinine urine ratio -     Hemoglobin A1c  Screening for lipid disorders -     Lipid panel -     CMP14+EGFR -     CBC with Differential/Platelet  Screening for HIV (human immunodeficiency virus) -     HIV Antibody (routine testing w rflx)  Need for hepatitis C screening test -     Hepatitis C antibody  Vitamin D deficiency -     VITAMIN D 25 Hydroxy (Vit-D Deficiency, Fractures)  Screening for thyroid disorder -     TSH + free T4  Screening for colon cancer -     Cologuard  Screening for prostate cancer  Encounter for diabetes type 2 eye exam -     Ambulatory referral to Ophthalmology  Testosterone deficiency in male -     Testosterone,Free and Total -     Ambulatory referral to Endocrinology  Elevated blood pressure reading Assessment & Plan: Vitals:   04/05/23 1117 04/05/23 1118  BP: (!) 147/92 (!) 148/95  Blood pressure not controlled , Patient reported not interested in medication  intervention.   Explained non pharmacological interventions such as low salt, DASH diet discussed. Educated on stress reduction and physical activity minimum 150 minutes per week. Discussed signs and symptoms of major cardiovascular event and need to present to the ED. Follow up in 4 weeks or sooner if needed with at home blood pressure reading logs. Patient verbalizes understanding regarding plan of care and all questions answered.      Return in about 4 months (around 08/05/2023) for hypertension, chronic follow-up, diabetes, re-check blood pressure.      Cruzita Lederer Newman Nip, FNP

## 2023-04-05 NOTE — Patient Instructions (Signed)

## 2023-04-05 NOTE — Assessment & Plan Note (Addendum)
Patient reported Trulicity 1.5 mg once weekly and has not been taking medication for one month. A1C 8.2 not goal last reading on 01/14/20, Labs ordered in today's visit. Discussed medication desired effects, potential side effects, and how to administer the medication. Nonpharmacological interventions such as low carb diet,high in vegetables and fruit discussed. Educated on importance of physical activity. Discussed signs and symptoms of hypoglycemia, hyperglycemia and need to present to the ED. Follow up in 3 months or sooner if needed. Patient verbalizes understanding regarding plan of care and all questions answered. Ophthalmology referral placed , Foot exam within desired limits

## 2023-04-06 DIAGNOSIS — Z114 Encounter for screening for human immunodeficiency virus [HIV]: Secondary | ICD-10-CM | POA: Diagnosis not present

## 2023-04-06 DIAGNOSIS — Z1159 Encounter for screening for other viral diseases: Secondary | ICD-10-CM | POA: Diagnosis not present

## 2023-04-06 DIAGNOSIS — E559 Vitamin D deficiency, unspecified: Secondary | ICD-10-CM | POA: Diagnosis not present

## 2023-04-06 DIAGNOSIS — E291 Testicular hypofunction: Secondary | ICD-10-CM | POA: Diagnosis not present

## 2023-04-06 DIAGNOSIS — E119 Type 2 diabetes mellitus without complications: Secondary | ICD-10-CM | POA: Diagnosis not present

## 2023-04-07 LAB — CBC WITH DIFFERENTIAL/PLATELET
Basophils Absolute: 0.1 10*3/uL (ref 0.0–0.2)
Basos: 1 %
Hematocrit: 55.2 % — ABNORMAL HIGH (ref 37.5–51.0)
MCH: 32.1 pg (ref 26.6–33.0)
MCHC: 34.1 g/dL (ref 31.5–35.7)
Monocytes Absolute: 0.4 10*3/uL (ref 0.1–0.9)
RBC: 5.86 x10E6/uL — ABNORMAL HIGH (ref 4.14–5.80)

## 2023-04-07 LAB — CMP14+EGFR
AST: 51 IU/L — ABNORMAL HIGH (ref 0–40)
Alkaline Phosphatase: 104 IU/L (ref 44–121)
BUN/Creatinine Ratio: 27 — ABNORMAL HIGH (ref 10–24)
CO2: 17 mmol/L — ABNORMAL LOW (ref 20–29)
Calcium: 9.3 mg/dL (ref 8.6–10.2)
Creatinine, Ser: 0.84 mg/dL (ref 0.76–1.27)
Globulin, Total: 2.9 g/dL (ref 1.5–4.5)
Potassium: 4.4 mmol/L (ref 3.5–5.2)
Total Protein: 7.1 g/dL (ref 6.0–8.5)

## 2023-04-07 LAB — TESTOSTERONE,FREE AND TOTAL

## 2023-04-08 LAB — CBC WITH DIFFERENTIAL/PLATELET
EOS (ABSOLUTE): 0.2 10*3/uL (ref 0.0–0.4)
Eos: 4 %
Hemoglobin: 18.8 g/dL — ABNORMAL HIGH (ref 13.0–17.7)
Immature Grans (Abs): 0 10*3/uL (ref 0.0–0.1)
Immature Granulocytes: 0 %
Lymphocytes Absolute: 1.8 10*3/uL (ref 0.7–3.1)
Lymphs: 30 %
MCV: 94 fL (ref 79–97)
Monocytes: 7 %
Neutrophils Absolute: 3.3 10*3/uL (ref 1.4–7.0)
Neutrophils: 58 %
Platelets: 225 10*3/uL (ref 150–450)
RDW: 12.4 % (ref 11.6–15.4)
WBC: 5.8 10*3/uL (ref 3.4–10.8)

## 2023-04-08 LAB — LIPID PANEL
Chol/HDL Ratio: 3.3 ratio (ref 0.0–5.0)
Cholesterol, Total: 211 mg/dL — ABNORMAL HIGH (ref 100–199)
HDL: 63 mg/dL (ref >39)
LDL Chol Calc (NIH): 78 mg/dL (ref 0–99)
Triglycerides: 441 mg/dL — ABNORMAL HIGH (ref 0–149)
VLDL Cholesterol Cal: 70 mg/dL — ABNORMAL HIGH (ref 5–40)

## 2023-04-08 LAB — MICROALBUMIN / CREATININE URINE RATIO
Creatinine, Urine: 165.5 mg/dL
Microalb/Creat Ratio: 74 mg/g creat — ABNORMAL HIGH (ref 0–29)
Microalbumin, Urine: 121.9 ug/mL

## 2023-04-08 LAB — HEMOGLOBIN A1C
Est. average glucose Bld gHb Est-mCnc: 194 mg/dL
Hgb A1c MFr Bld: 8.4 % — ABNORMAL HIGH (ref 4.8–5.6)

## 2023-04-08 LAB — CMP14+EGFR
ALT: 85 IU/L — ABNORMAL HIGH (ref 0–44)
Albumin/Globulin Ratio: 1.4 (ref 1.2–2.2)
Albumin: 4.2 g/dL (ref 3.9–4.9)
BUN: 23 mg/dL (ref 8–27)
Bilirubin Total: 0.7 mg/dL (ref 0.0–1.2)
Chloride: 97 mmol/L (ref 96–106)
Glucose: 230 mg/dL — ABNORMAL HIGH (ref 70–99)
Sodium: 136 mmol/L (ref 134–144)
eGFR: 99 mL/min/{1.73_m2} (ref >59)

## 2023-04-08 LAB — HIV ANTIBODY (ROUTINE TESTING W REFLEX): HIV Screen 4th Generation wRfx: NONREACTIVE

## 2023-04-08 LAB — TSH+FREE T4
Free T4: 1.18 ng/dL (ref 0.82–1.77)
TSH: 1.53 u[IU]/mL (ref 0.450–4.500)

## 2023-04-08 LAB — HEPATITIS C ANTIBODY: Hep C Virus Ab: NONREACTIVE

## 2023-04-08 LAB — VITAMIN D 25 HYDROXY (VIT D DEFICIENCY, FRACTURES): Vit D, 25-Hydroxy: 11.3 ng/mL — ABNORMAL LOW (ref 30.0–100.0)

## 2023-04-09 ENCOUNTER — Other Ambulatory Visit: Payer: Self-pay | Admitting: Family Medicine

## 2023-04-09 ENCOUNTER — Telehealth: Payer: Self-pay | Admitting: Family Medicine

## 2023-04-09 MED ORDER — TRULICITY 1.5 MG/0.5ML ~~LOC~~ SOAJ: 1.5000 mg | SUBCUTANEOUS | 0 refills | Status: AC

## 2023-04-09 MED ORDER — FENOFIBRATE 40 MG PO TABS: 1.0000 | ORAL_TABLET | Freq: Every day | ORAL | 3 refills | Status: DC

## 2023-04-09 MED ORDER — GLIMEPIRIDE 2 MG PO TABS
2.0000 mg | ORAL_TABLET | Freq: Every day | ORAL | 3 refills | Status: DC
Start: 2023-04-09 — End: 2023-05-02

## 2023-04-09 NOTE — Progress Notes (Signed)
Please inform patient ,  Hemoglobin A1c 8.4 type 2 diabetes not controlled Medications sent to pharmacy - trulicity 1.5mg  injection once a week and Glimepiride 2 mg daily- Follow up in 3 months to recheck labs and medication and type 2 diabetes management   It is important to follow a DASH diet which includes vegetables,fruits,whole grains, fat free or low fat diary,fish,poultry,beans,nuts and seeds,vegetable oils.    Vitamin D levels low, I advise to taking  over the counter supplements of vitamin D [phone] IU/day to prevent low vitamin D levels. Consuming Vitamin D rich food sources include fish, salmon, sardines, egg yolks, red meat, liver, oranges, soy milk.    Triglycerides levels elevated, start lifestyle modifications follow diet low in saturated fat, reduce dietary salt intake, avoid fatty foods. I encourage over the counter omega-3 fish oil 1000 mg twice daily. Fenofibrate 40 mg daily- Medication sent to pharmacy Follow up in 3 months to recheck labs  Liver enzymes elevated possible due to signs of increased build up fat in the liver. I encourage weight loss, healthy eating habits- low carb high protein diet, avoid fatty foods, smoking and alcohol intake. Maintain an excercise routine to minimum of 150 minuties a week.

## 2023-04-09 NOTE — Telephone Encounter (Signed)
Took labs last week needs to get medicine to [facility] in [place] for his diabetic pen. Needs this medicine sugar is high.  Call back # [phone] to confirm this medicine was sent to his pharmacy

## 2023-04-10 DIAGNOSIS — E119 Type 2 diabetes mellitus without complications: Secondary | ICD-10-CM | POA: Diagnosis not present

## 2023-04-10 DIAGNOSIS — S46091A Other injury of muscle(s) and tendon(s) of the rotator cuff of right shoulder, initial encounter: Secondary | ICD-10-CM | POA: Diagnosis not present

## 2023-04-10 DIAGNOSIS — E291 Testicular hypofunction: Secondary | ICD-10-CM | POA: Diagnosis not present

## 2023-04-10 DIAGNOSIS — E782 Mixed hyperlipidemia: Secondary | ICD-10-CM | POA: Diagnosis not present

## 2023-04-10 NOTE — Telephone Encounter (Signed)
LVM

## 2023-04-11 ENCOUNTER — Other Ambulatory Visit: Payer: Self-pay | Admitting: Family Medicine

## 2023-04-11 MED ORDER — FENOFIBRATE 48 MG PO TABS: 48.0000 mg | ORAL_TABLET | Freq: Every day | ORAL | 1 refills | Status: DC

## 2023-04-11 MED ORDER — FENOFIBRATE MICRONIZED 43 MG PO CAPS: 43.0000 mg | ORAL_CAPSULE | Freq: Every day | ORAL | 1 refills | Status: DC

## 2023-04-11 NOTE — Addendum Note (Signed)
Addended by: Rica Records on: 04/11/2023 04:33 PM   Modules accepted: Orders

## 2023-04-11 NOTE — Telephone Encounter (Signed)
sent 

## 2023-04-13 LAB — TESTOSTERONE,FREE AND TOTAL: Testosterone: 753 ng/dL (ref 264–916)

## 2023-04-29 ENCOUNTER — Other Ambulatory Visit: Payer: Self-pay

## 2023-04-29 ENCOUNTER — Emergency Department (HOSPITAL_COMMUNITY): Payer: Federal, State, Local not specified - PPO

## 2023-04-29 ENCOUNTER — Encounter (HOSPITAL_COMMUNITY): Payer: Self-pay

## 2023-04-29 ENCOUNTER — Inpatient Hospital Stay (HOSPITAL_COMMUNITY)
Admission: EM | Admit: 2023-04-29 | Discharge: 2023-05-02 | DRG: 308 | Disposition: A | Discharge status: Home / Self Care | Payer: Federal, State, Local not specified - PPO | Attending: Family Medicine | Admitting: Family Medicine

## 2023-04-29 DIAGNOSIS — I110 Hypertensive heart disease with heart failure: Secondary | ICD-10-CM | POA: Diagnosis not present

## 2023-04-29 DIAGNOSIS — R9431 Abnormal electrocardiogram [ECG] [EKG]: Secondary | ICD-10-CM | POA: Diagnosis not present

## 2023-04-29 DIAGNOSIS — I4892 Unspecified atrial flutter: Principal | ICD-10-CM | POA: Diagnosis present

## 2023-04-29 DIAGNOSIS — G4733 Obstructive sleep apnea (adult) (pediatric): Secondary | ICD-10-CM | POA: Diagnosis present

## 2023-04-29 DIAGNOSIS — Z7901 Long term (current) use of anticoagulants: Secondary | ICD-10-CM | POA: Diagnosis not present

## 2023-04-29 DIAGNOSIS — Z79899 Other long term (current) drug therapy: Secondary | ICD-10-CM

## 2023-04-29 DIAGNOSIS — R7989 Other specified abnormal findings of blood chemistry: Secondary | ICD-10-CM | POA: Insufficient documentation

## 2023-04-29 DIAGNOSIS — E785 Hyperlipidemia, unspecified: Secondary | ICD-10-CM | POA: Diagnosis not present

## 2023-04-29 DIAGNOSIS — E119 Type 2 diabetes mellitus without complications: Secondary | ICD-10-CM

## 2023-04-29 DIAGNOSIS — Z794 Long term (current) use of insulin: Secondary | ICD-10-CM | POA: Diagnosis not present

## 2023-04-29 DIAGNOSIS — E1140 Type 2 diabetes mellitus with diabetic neuropathy, unspecified: Secondary | ICD-10-CM | POA: Diagnosis present

## 2023-04-29 DIAGNOSIS — Z823 Family history of stroke: Secondary | ICD-10-CM

## 2023-04-29 DIAGNOSIS — E876 Hypokalemia: Secondary | ICD-10-CM | POA: Diagnosis not present

## 2023-04-29 DIAGNOSIS — Z841 Family history of disorders of kidney and ureter: Secondary | ICD-10-CM

## 2023-04-29 DIAGNOSIS — Z8261 Family history of arthritis: Secondary | ICD-10-CM

## 2023-04-29 DIAGNOSIS — E1100 Type 2 diabetes mellitus with hyperosmolarity without nonketotic hyperglycemic-hyperosmolar coma (NKHHC): Secondary | ICD-10-CM | POA: Diagnosis not present

## 2023-04-29 DIAGNOSIS — E860 Dehydration: Secondary | ICD-10-CM | POA: Insufficient documentation

## 2023-04-29 DIAGNOSIS — E1165 Type 2 diabetes mellitus with hyperglycemia: Secondary | ICD-10-CM | POA: Diagnosis present

## 2023-04-29 DIAGNOSIS — Z885 Allergy status to narcotic agent status: Secondary | ICD-10-CM

## 2023-04-29 DIAGNOSIS — Z6841 Body Mass Index (BMI) 40.0 and over, adult: Secondary | ICD-10-CM | POA: Diagnosis not present

## 2023-04-29 DIAGNOSIS — Z8249 Family history of ischemic heart disease and other diseases of the circulatory system: Secondary | ICD-10-CM | POA: Diagnosis not present

## 2023-04-29 DIAGNOSIS — E7800 Pure hypercholesterolemia, unspecified: Secondary | ICD-10-CM | POA: Insufficient documentation

## 2023-04-29 DIAGNOSIS — R739 Hyperglycemia, unspecified: Secondary | ICD-10-CM | POA: Diagnosis present

## 2023-04-29 DIAGNOSIS — K219 Gastro-esophageal reflux disease without esophagitis: Secondary | ICD-10-CM | POA: Diagnosis present

## 2023-04-29 DIAGNOSIS — I5022 Chronic systolic (congestive) heart failure: Secondary | ICD-10-CM | POA: Diagnosis not present

## 2023-04-29 DIAGNOSIS — I471 Supraventricular tachycardia, unspecified: Secondary | ICD-10-CM | POA: Diagnosis present

## 2023-04-29 DIAGNOSIS — Z833 Family history of diabetes mellitus: Secondary | ICD-10-CM

## 2023-04-29 DIAGNOSIS — E66813 Obesity, class 3: Secondary | ICD-10-CM | POA: Insufficient documentation

## 2023-04-29 DIAGNOSIS — E1169 Type 2 diabetes mellitus with other specified complication: Secondary | ICD-10-CM | POA: Diagnosis not present

## 2023-04-29 DIAGNOSIS — Z83438 Family history of other disorder of lipoprotein metabolism and other lipidemia: Secondary | ICD-10-CM

## 2023-04-29 DIAGNOSIS — Z7985 Long-term (current) use of injectable non-insulin antidiabetic drugs: Secondary | ICD-10-CM

## 2023-04-29 DIAGNOSIS — K029 Dental caries, unspecified: Secondary | ICD-10-CM | POA: Diagnosis present

## 2023-04-29 DIAGNOSIS — R000 Tachycardia, unspecified: Secondary | ICD-10-CM | POA: Diagnosis not present

## 2023-04-29 DIAGNOSIS — R81 Glycosuria: Secondary | ICD-10-CM | POA: Diagnosis present

## 2023-04-29 DIAGNOSIS — Z825 Family history of asthma and other chronic lower respiratory diseases: Secondary | ICD-10-CM

## 2023-04-29 DIAGNOSIS — F17210 Nicotine dependence, cigarettes, uncomplicated: Secondary | ICD-10-CM | POA: Diagnosis present

## 2023-04-29 DIAGNOSIS — E872 Acidosis, unspecified: Secondary | ICD-10-CM | POA: Diagnosis not present

## 2023-04-29 DIAGNOSIS — I5020 Unspecified systolic (congestive) heart failure: Secondary | ICD-10-CM | POA: Diagnosis not present

## 2023-04-29 HISTORY — DX: Pure hypercholesterolemia, unspecified: E78.00

## 2023-04-29 LAB — BLOOD GAS, ARTERIAL
Acid-Base Excess: 2.1 mmol/L — ABNORMAL HIGH (ref 0.0–2.0)
Bicarbonate: 25.5 mmol/L (ref 20.0–28.0)
Drawn by: 336832
O2 Saturation: 97.8 %
Patient temperature: 37
pCO2 arterial: 35 mmHg (ref 32–48)
pH, Arterial: 7.47 — ABNORMAL HIGH (ref 7.35–7.45)
pO2, Arterial: 81 mmHg — ABNORMAL LOW (ref 83–108)

## 2023-04-29 LAB — CBC WITH DIFFERENTIAL/PLATELET
Abs Immature Granulocytes: 0.02 10*3/uL (ref 0.00–0.07)
Basophils Absolute: 0.1 10*3/uL (ref 0.0–0.1)
Basophils Relative: 1 %
Eosinophils Absolute: 0.2 10*3/uL (ref 0.0–0.5)
Eosinophils Relative: 3 %
HCT: 53.1 % — ABNORMAL HIGH (ref 39.0–52.0)
Hemoglobin: 18.2 g/dL — ABNORMAL HIGH (ref 13.0–17.0)
Immature Granulocytes: 0 %
Lymphocytes Relative: 25 %
Lymphs Abs: 1.7 10*3/uL (ref 0.7–4.0)
MCH: 31.8 pg (ref 26.0–34.0)
MCHC: 34.3 g/dL (ref 30.0–36.0)
MCV: 92.7 fL (ref 80.0–100.0)
Monocytes Absolute: 0.6 10*3/uL (ref 0.1–1.0)
Monocytes Relative: 9 %
Neutro Abs: 4.1 10*3/uL (ref 1.7–7.7)
Neutrophils Relative %: 62 %
Platelets: 180 10*3/uL (ref 150–400)
RBC: 5.73 MIL/uL (ref 4.22–5.81)
RDW: 12.4 % (ref 11.5–15.5)
WBC: 6.7 10*3/uL (ref 4.0–10.5)
nRBC: 0 % (ref 0.0–0.2)

## 2023-04-29 LAB — BASIC METABOLIC PANEL
Anion gap: 11 (ref 5–15)
Anion gap: 10 (ref 5–15)
BUN: 22 mg/dL (ref 8–23)
BUN: 19 mg/dL (ref 8–23)
CO2: 22 mmol/L (ref 22–32)
CO2: 24 mmol/L (ref 22–32)
Calcium: 8.8 mg/dL — ABNORMAL LOW (ref 8.9–10.3)
Calcium: 8.8 mg/dL — ABNORMAL LOW (ref 8.9–10.3)
Chloride: 91 mmol/L — ABNORMAL LOW (ref 98–111)
Chloride: 98 mmol/L (ref 98–111)
Creatinine, Ser: 1.1 mg/dL (ref 0.61–1.24)
Creatinine, Ser: 0.85 mg/dL (ref 0.61–1.24)
GFR, Estimated: >60 mL/min (ref >60)
GFR, Estimated: >60 mL/min (ref >60)
Glucose, Bld: 692 mg/dL (ref 70–99)
Glucose, Bld: 240 mg/dL — ABNORMAL HIGH (ref 70–99)
Potassium: 4.3 mmol/L (ref 3.5–5.1)
Potassium: 3.5 mmol/L (ref 3.5–5.1)
Sodium: 124 mmol/L — ABNORMAL LOW (ref 135–145)
Sodium: 132 mmol/L — ABNORMAL LOW (ref 135–145)

## 2023-04-29 LAB — URINALYSIS, ROUTINE W REFLEX MICROSCOPIC
Bilirubin Urine: NEGATIVE
Glucose, UA: >=500 mg/dL — AB
Hgb urine dipstick: NEGATIVE
Ketones, ur: NEGATIVE mg/dL
Leukocytes,Ua: NEGATIVE
Nitrite: NEGATIVE
Protein, ur: NEGATIVE mg/dL
Specific Gravity, Urine: <1.005 — ABNORMAL LOW (ref 1.005–1.030)
pH: 6 (ref 5.0–8.0)

## 2023-04-29 LAB — LACTIC ACID, PLASMA
Lactic Acid, Venous: 3 mmol/L (ref 0.5–1.9)
Lactic Acid, Venous: 2.6 mmol/L (ref 0.5–1.9)

## 2023-04-29 LAB — GLUCOSE, CAPILLARY: Glucose-Capillary: 152 mg/dL — ABNORMAL HIGH (ref 70–99)

## 2023-04-29 LAB — BETA-HYDROXYBUTYRIC ACID: Beta-Hydroxybutyric Acid: 0.15 mmol/L (ref 0.05–0.27)

## 2023-04-29 LAB — CBG MONITORING, ED
Glucose-Capillary: >600 mg/dL (ref 70–99)
Glucose-Capillary: 497 mg/dL — ABNORMAL HIGH (ref 70–99)
Glucose-Capillary: 442 mg/dL — ABNORMAL HIGH (ref 70–99)
Glucose-Capillary: 223 mg/dL — ABNORMAL HIGH (ref 70–99)

## 2023-04-29 LAB — TROPONIN I (HIGH SENSITIVITY): Troponin I (High Sensitivity): 42 ng/L — ABNORMAL HIGH (ref <18)

## 2023-04-29 LAB — URINALYSIS, MICROSCOPIC (REFLEX)
Bacteria, UA: NONE SEEN
RBC / HPF: NONE SEEN RBC/hpf (ref 0–5)
Squamous Epithelial / HPF: NONE SEEN /HPF (ref 0–5)

## 2023-04-29 LAB — MRSA NEXT GEN BY PCR, NASAL: MRSA by PCR Next Gen: NOT DETECTED

## 2023-04-29 MED ORDER — ADENOSINE 6 MG/2ML IV SOLN
12.0000 mg | Freq: Once | INTRAVENOUS | Status: AC
  Administered 2023-04-29: 12 mg via INTRAVENOUS

## 2023-04-29 MED ORDER — FENOFIBRATE 54 MG PO TABS
54.0000 mg | ORAL_TABLET | Freq: Every day | ORAL | Status: DC
  Administered 2023-05-02: 54 mg via ORAL
  Filled 2023-04-29 (×6): qty 1

## 2023-04-29 MED ORDER — LACTATED RINGERS IV BOLUS
20.0000 mL/kg | Freq: Once | INTRAVENOUS | Status: AC
  Administered 2023-04-29: 2884 mL via INTRAVENOUS

## 2023-04-29 MED ORDER — ADENOSINE 6 MG/2ML IV SOLN
INTRAVENOUS | Status: AC
  Filled 2023-04-29: qty 4

## 2023-04-29 MED ORDER — ONDANSETRON HCL 4 MG PO TABS: 4.0000 mg | ORAL_TABLET | Freq: Four times a day (QID) | ORAL | Status: DC | PRN

## 2023-04-29 MED ORDER — ADENOSINE 6 MG/2ML IV SOLN
6.0000 mg | Freq: Once | INTRAVENOUS | Status: AC
  Administered 2023-04-29: 6 mg via INTRAVENOUS

## 2023-04-29 MED ORDER — DILTIAZEM LOAD VIA INFUSION
20.0000 mg | Freq: Once | INTRAVENOUS | Status: AC
  Administered 2023-04-29: 20 mg via INTRAVENOUS
  Filled 2023-04-29: qty 20

## 2023-04-29 MED ORDER — OMEGA-3-ACID ETHYL ESTERS 1 G PO CAPS
1.0000 g | ORAL_CAPSULE | Freq: Every day | ORAL | Status: DC
  Administered 2023-04-30 – 2023-05-02 (×3): 1 g via ORAL
  Filled 2023-04-29 (×3): qty 1

## 2023-04-29 MED ORDER — ENOXAPARIN SODIUM 150 MG/ML IJ SOSY
140.0000 mg | PREFILLED_SYRINGE | Freq: Two times a day (BID) | INTRAMUSCULAR | Status: DC
  Administered 2023-04-29 – 2023-05-02 (×6): 140 mg via SUBCUTANEOUS
  Filled 2023-04-29 (×11): qty 1

## 2023-04-29 MED ORDER — INSULIN REGULAR(HUMAN) IN NACL 100-0.9 UT/100ML-% IV SOLN
INTRAVENOUS | Status: DC
  Administered 2023-04-29: 18 [IU]/h via INTRAVENOUS
  Administered 2023-04-30: 2.8 [IU]/h via INTRAVENOUS
  Filled 2023-04-29: qty 100

## 2023-04-29 MED ORDER — POTASSIUM CHLORIDE 10 MEQ/100ML IV SOLN
10.0000 meq | INTRAVENOUS | Status: AC
  Administered 2023-04-29 (×2): 10 meq via INTRAVENOUS
  Filled 2023-04-29 (×2): qty 100

## 2023-04-29 MED ORDER — ONDANSETRON HCL 4 MG/2ML IJ SOLN: 4.0000 mg | Freq: Four times a day (QID) | INTRAMUSCULAR | Status: DC | PRN

## 2023-04-29 MED ORDER — PREGABALIN 75 MG PO CAPS
75.0000 mg | ORAL_CAPSULE | Freq: Two times a day (BID) | ORAL | Status: DC
  Administered 2023-04-29 – 2023-05-02 (×6): 75 mg via ORAL
  Filled 2023-04-29 (×6): qty 1

## 2023-04-29 MED ORDER — ACETAMINOPHEN 650 MG RE SUPP: 650.0000 mg | Freq: Four times a day (QID) | RECTAL | Status: DC | PRN

## 2023-04-29 MED ORDER — DILTIAZEM HCL-DEXTROSE 125-5 MG/125ML-% IV SOLN (PREMIX)
5.0000 mg/h | INTRAVENOUS | Status: DC
  Administered 2023-04-29: 5 mg/h via INTRAVENOUS
  Administered 2023-05-01: 2.5 mg/h via INTRAVENOUS
  Administered 2023-05-01: 10 mg/h via INTRAVENOUS
  Filled 2023-04-29 (×4): qty 125

## 2023-04-29 MED ORDER — ACETAMINOPHEN 325 MG PO TABS
650.0000 mg | ORAL_TABLET | Freq: Four times a day (QID) | ORAL | Status: DC | PRN
  Administered 2023-04-29 – 2023-05-01 (×6): 650 mg via ORAL
  Filled 2023-04-29 (×6): qty 2

## 2023-04-29 MED ORDER — DEXTROSE IN LACTATED RINGERS 5 % IV SOLN: INTRAVENOUS | Status: DC

## 2023-04-29 MED ORDER — DEXTROSE 50 % IV SOLN: 0.0000 mL | INTRAVENOUS | Status: DC | PRN

## 2023-04-29 MED ORDER — LACTATED RINGERS IV SOLN: INTRAVENOUS | Status: DC

## 2023-04-29 MED ORDER — PROCHLORPERAZINE EDISYLATE 10 MG/2ML IJ SOLN: 10.0000 mg | Freq: Four times a day (QID) | INTRAMUSCULAR | Status: DC | PRN

## 2023-04-29 NOTE — Progress Notes (Signed)
ANTICOAGULATION CONSULT NOTE  Pharmacy Consult for Enoxaparin Indication: atrial fibrillation  Allergies  Allergen Reactions   Trazodone Palpitations and Shortness Of Breath    Patient Measurements: Height: 6' (182.9 cm) Weight: (!) 144.2 kg (318 lb) IBW/kg (Calculated) : 77.6  Vital Signs: Temp: 98.2 F (36.8 C) (05/19 1639) Temp Source: Oral (05/19 1639) BP: 122/77 (05/19 2030) Pulse Rate: 93 (05/19 2030)  Labs: Recent Labs    04/29/23 1659 04/29/23 1729 04/29/23 2028  HGB 18.2*  --   --   HCT 53.1*  --   --   PLT 180  --   --   CREATININE 1.10  --  0.85  TROPONINIHS  --  42*  --     Estimated Creatinine Clearance: 134.5 mL/min (by C-G formula based on SCr of 0.85 mg/dL).   Assessment: 62 YO male not on anticoagulation PTA who presented with hyperglycemia and SVT found to have new atrial fibrillation. Pharmacy consulted to start therapeutic enoxaparin. CBC stable, platelets 180.   Goal of Therapy:  Anti-Xa level 0.6-1 units/ml 4hrs after LMWH dose given Monitor platelets by anticoagulation protocol: Yes   Plan:  Enoxaparin 140 mg SQ Q12h Recommend checking Anti-Xa level as needed when at steady state Continue to monitor H&H and platelets   Thank you for allowing pharmacy to be a part of this patient's care.  Thelma Barge, PharmD Clinical Pharmacist

## 2023-04-29 NOTE — ED Triage Notes (Signed)
Pt reports he was off of his diabetes medicine for 2 months and was restarted a few weeks ago and was doing well.  Reports he started to feel like his sugar was elevated when he woke up this morning.  Heart rate noted to be 170 in triage and no hx of same.  FSBS >600.

## 2023-04-29 NOTE — ED Notes (Signed)
Insulin drip to remain at 18 units

## 2023-04-29 NOTE — ED Provider Notes (Signed)
[facility] AT [facility] Provider Note   CSN: [id number] Arrival date & time: 04/29/23  1629     History  Chief Complaint  Patient presents with   Hyperglycemia    David Hammond is a 62 y.o. male.   Hyperglycemia    This patient is a 62 year old male, he has a history of diabetes controlled on Trulicity although he was off it for several months and only recently went back on it.  He works for the [employer] and has been traveling with very little sleep for the last week.  This morning he woke up feeling very bad with a dry mouth, lightheaded, arrives in SVT  Home Medications Prior to Admission medications   Medication Sig Start Date End Date Taking? Authorizing Provider  Dulaglutide (TRULICITY) 1.5 MG/0.5ML SOPN Inject 1.5 mg into the skin once a week. 04/09/23  Yes Del Newman Nip, Tenna Child, FNP  fenofibrate (TRICOR) 48 MG tablet Take 48 mg by mouth daily. 04/11/23  Yes [provider]  naproxen sodium (ALEVE) 220 MG tablet Take 440 mg by mouth daily as needed (pain).   Yes [provider]  testosterone cypionate (DEPOTESTOSTERONE CYPIONATE) 200 MG/ML injection Inject 0.79mL into muscle every 2 weeks. Use 18 gauge to draw up and 23 gauge to administer. Patient taking differently: 0.5 mLs every 7 (seven) days.  Use 18 gauge to draw up and 23 gauge to administer. 01/14/20  Yes Roma Kayser, MD  glimepiride (AMARYL) 2 MG tablet Take 1 tablet (2 mg total) by mouth daily before breakfast. Patient not taking: Reported on 04/29/2023 04/09/23   Rica Records, FNP  Needles & Syringes MISC  12/11/08   [provider]  Needles & Syringes MISC  12/11/08   [provider]  omega-3 acid ethyl esters (LOVAZA) 1 g capsule Take by mouth. 04/14/23 04/08/24  [provider]  SYRINGE/NEEDLE, DISP, 1 ML 23G X 1" 1 ML MISC Use to administer testosterone 01/14/20   Nida, Denman Melchizedek, MD  Syringe/Needle, Disp, 18G X 1" 1 ML  MISC Use to draw up testosterone 01/14/20   Nida, Denman Dairon, MD  tiZANidine (ZANAFLEX) 4 MG capsule Take 1 capsule (4 mg total) by mouth 3 (three) times daily as needed for muscle spasms. Patient not taking: Reported on 04/05/2023 01/29/23   Kathryne Hitch, MD      Allergies    Trazodone    Review of Systems   Review of Systems  All other systems reviewed and are negative.   Physical Exam Updated Vital Signs BP (!) 144/77   Pulse 89   Temp 98.2 F (36.8 C) (Oral)   Resp 18   Ht 1.829 m (6')   Wt (!) 144.2 kg   SpO2 98%   BMI 43.13 kg/m  Physical Exam Vitals and nursing note reviewed.  Constitutional:      General: He is in acute distress.     Appearance: He is well-developed. He is ill-appearing.  HENT:     Head: Normocephalic and atraumatic.     Mouth/Throat:     Mouth: Mucous membranes are dry.     Pharynx: No oropharyngeal exudate.  Eyes:     General: No scleral icterus.       Right eye: No discharge.        Left eye: No discharge.     Conjunctiva/sclera: Conjunctivae normal.     Pupils: Pupils are equal, round, and reactive to light.  Neck:  Thyroid: No thyromegaly.     Vascular: No JVD.  Cardiovascular:     Rate and Rhythm: Regular rhythm. Tachycardia present.     Heart sounds: Normal heart sounds. No murmur heard.    No friction rub. No gallop.  Pulmonary:     Effort: Pulmonary effort is normal. No respiratory distress.     Breath sounds: Normal breath sounds. No wheezing or rales.  Abdominal:     General: Bowel sounds are normal. There is no distension.     Palpations: Abdomen is soft. There is no mass.     Tenderness: There is no abdominal tenderness.  Musculoskeletal:        General: No tenderness. Normal range of motion.     Cervical back: Normal range of motion and neck supple.     Right lower leg: Edema present.     Left lower leg: Edema present.  Lymphadenopathy:     Cervical: No cervical adenopathy.  Skin:    General: Skin is  warm and dry.     Findings: No erythema or rash.  Neurological:     Mental Status: He is alert.     Coordination: Coordination normal.  Psychiatric:        Behavior: Behavior normal.     ED Results / Procedures / Treatments   Labs (all labs ordered are listed, but only abnormal results are displayed) Labs Reviewed  BASIC METABOLIC PANEL - Abnormal; Notable for the following components:      Result Value   Sodium 124 (*)    Chloride 91 (*)    Glucose, Bld 692 (*)    Calcium 8.8 (*)    All other components within normal limits  CBC WITH DIFFERENTIAL/PLATELET - Abnormal; Notable for the following components:   Hemoglobin 18.2 (*)    HCT 53.1 (*)    All other components within normal limits  URINALYSIS, ROUTINE W REFLEX MICROSCOPIC - Abnormal; Notable for the following components:   Specific Gravity, Urine <1.005 (*)    Glucose, UA >=500 (*)    All other components within normal limits  LACTIC ACID, PLASMA - Abnormal; Notable for the following components:   Lactic Acid, Venous 3.0 (*)    All other components within normal limits  BLOOD GAS, ARTERIAL - Abnormal; Notable for the following components:   pH, Arterial 7.47 (*)    pO2, Arterial 81 (*)    Acid-Base Excess 2.1 (*)    All other components within normal limits  CBG MONITORING, ED - Abnormal; Notable for the following components:   Glucose-Capillary >600 (*)    All other components within normal limits  CBG MONITORING, ED - Abnormal; Notable for the following components:   Glucose-Capillary 497 (*)    All other components within normal limits  CBG MONITORING, ED - Abnormal; Notable for the following components:   Glucose-Capillary 442 (*)    All other components within normal limits  TROPONIN I (HIGH SENSITIVITY) - Abnormal; Notable for the following components:   Troponin I (High Sensitivity) 42 (*)    All other components within normal limits  BETA-HYDROXYBUTYRIC ACID  URINALYSIS, MICROSCOPIC (REFLEX)  BASIC  METABOLIC PANEL  BASIC METABOLIC PANEL  BASIC METABOLIC PANEL  BETA-HYDROXYBUTYRIC ACID  LACTIC ACID, PLASMA    EKG EKG Interpretation  Date/Time:  Sunday Apr 29 2023 16:45:25 EDT Ventricular Rate:  169 PR Interval:  82 QRS Duration: 124 QT Interval:  308 QTC Calculation: 517 R Axis:   69 Text Interpretation: REgular tachycardia Nonspecific  intraventricular conduction delay No old tracing to compare Confirmed by Eber Hong ([id number]) on 04/29/2023 5:19:21 PM  Repeat EKG performed on Apr 29, 2023 at 6:44 PM shows atrial flutter with 3-1 block rate of about 89 bpm, no significant ST elevation, QRS narrow, no LVH, abnormal EKG but rate slowed compared to prior    Radiology DG Chest Portable 1 View  Result Date: 04/29/2023 CLINICAL DATA:  Hyperglycemia and tachycardia EXAM: PORTABLE CHEST 1 VIEW COMPARISON:  Chest radiograph dated 08/25/2020 FINDINGS: Slightly low lung volumes. No focal consolidations. No pleural effusion or pneumothorax. Enlarged cardiomediastinal silhouette is likely projectional. No acute osseous abnormality. IMPRESSION: No active disease. Electronically Signed   By: Agustin Cree M.D.   On: 04/29/2023 18:19    Procedures .Critical Care  Performed by: Eber Hong, MD Authorized by: Eber Hong, MD   Critical care provider statement:    Critical care time (minutes):  80   Critical care time was exclusive of:  Separately billable procedures and treating other patients and teaching time   Critical care was necessary to treat or prevent imminent or life-threatening deterioration of the following conditions:  Endocrine crisis and circulatory failure   Critical care was time spent personally by me on the following activities:  Development of treatment plan with patient or surrogate, discussions with consultants, evaluation of patient's response to treatment, examination of patient, obtaining history from patient or surrogate, review of old charts, re-evaluation of  patient's condition, pulse oximetry, ordering and review of radiographic studies, ordering and review of laboratory studies and ordering and performing treatments and interventions   I assumed direction of critical care for this patient from another provider in my specialty: no     Care discussed with: admitting provider   Comments:           Medications Ordered in ED Medications  insulin regular, human (MYXREDLIN) 100 units/ 100 mL infusion (18 Units/hr Intravenous New Bag/Given 04/29/23 1834)  lactated ringers infusion ( Intravenous New Bag/Given 04/29/23 1920)  dextrose 5 % in lactated ringers infusion (0 mLs Intravenous Hold 04/29/23 1922)  dextrose 50 % solution 0-50 mL (has no administration in time range)  potassium chloride 10 mEq in 100 mL IVPB (10 mEq Intravenous New Bag/Given 04/29/23 1850)  diltiazem (CARDIZEM) 1 mg/mL load via infusion 20 mg (20 mg Intravenous Bolus from Bag 04/29/23 1740)    And  diltiazem (CARDIZEM) 125 mg in dextrose 5% 125 mL (1 mg/mL) infusion (5 mg/hr Intravenous New Bag/Given 04/29/23 1738)  adenosine (ADENOCARD) 6 MG/2ML injection 6 mg (6 mg Intravenous Given 04/29/23 1656)  lactated ringers bolus 2,884 mL (0 mLs Intravenous Stopped 04/29/23 1918)  adenosine (ADENOCARD) 6 MG/2ML injection 12 mg (12 mg Intravenous Given 04/29/23 1725)    ED Course/ Medical Decision Making/ A&P                             Medical Decision Making Amount and/or Complexity of Data Reviewed Labs: ordered. Radiology: ordered.  Risk Prescription drug management. Decision regarding hospitalization.    This patient presents to the ED for concern of hyperglycemia and arrhythmia, this involves an extensive number of treatment options, and is a complaint that carries with it a high risk of complications and morbidity.  The differential diagnosis includes SVT, atrial flutter with 2-1 block, could be DKA   Co morbidities that complicate the patient  evaluation  Diabetes   Additional history obtained:  Additional history obtained  from medical records External records from outside source obtained and reviewed including prior office visits, no recent admissions to the hospital going back several years.   Lab Tests:  I Ordered, and personally interpreted labs.  The pertinent results include:  Hyperglycemia, no AG acidosis, CBC without acute severe findings.     Imaging Studies ordered:  I ordered imaging studies including chest x-ray I independently visualized and interpreted imaging which showed no acute findings I agree with the radiologist interpretation   Cardiac Monitoring: / EKG:  The patient was maintained on a cardiac monitor.  I personally viewed and interpreted the cardiac monitored which showed an underlying rhythm of: Rapid narrow complex tachycardia, rate of 170 bpm   Consultations Obtained:  I requested consultation with the hospitalist Dr. Thomes Dinning,  and discussed lab and imaging findings as well as pertinent plan - they recommend: Admission to higher level of care on Cardizem drip   Problem List / ED Course / Critical interventions / Medication management  This patient is acutely and critically ill with severe hyperglycemia, severe tachycardia and what appears to be atrial flutter after adenosine was given.  The patient did not respond to 6 mg of adenosine and after 12 was given it slowed the rhythm down low enough that we could see that there was flutter waves.  He went back up to 170 bpm and Cardizem was started. I ordered medication including insulin drip and Cardizem drip as well as IV fluids for severe hyperglycemia and atrial arrhythmia Reevaluation of the patient after these medicines showed that the patient improved but still ill on a continuous Cardizem drip.  Blood sugar has reduced to 442, heart rate is reduced to 90 I have reviewed the patients home medicines and have made adjustments as  needed   Social Determinants of Health:  None   Test / Admission - Considered:  Admit to higher level of care         Final Clinical Impression(s) / ED Diagnoses Final diagnoses:  Atrial flutter with rapid ventricular response (HCC)  Hyperglycemia      Eber Hong, MD 04/29/23 1949

## 2023-04-29 NOTE — ED Notes (Signed)
ED TO INPATIENT HANDOFF REPORT  ED Nurse Name and Phone #: jess [phone]  S Name/Age/Gender David Hammond 62 y.o. male Room/Bed: APA04/APA04  Code Status   Code Status: Not on file  Home/SNF/Other Home Patient oriented to: self, place, time, and situation Is this baseline? Yes   Triage Complete: Triage complete  Chief Complaint Hyperglycemia [R73.9]  Triage Note Pt reports he was off of his diabetes medicine for 2 months and was restarted a few weeks ago and was doing well.  Reports he started to feel like his sugar was elevated when he woke up this morning.  Heart rate noted to be 170 in triage and no hx of same.  FSBS >600.   Allergies Allergies  Allergen Reactions   Trazodone Palpitations and Shortness Of Breath    Level of Care/Admitting Diagnosis ED Disposition     ED Disposition  Admit   Condition  --   Comment  Hospital Area: [facility] [[id number]]  Level of Care: Stepdown [14]  Covid Evaluation: Asymptomatic - no recent exposure (last 10 days) testing not required  Diagnosis: Hyperglycemia [292486]  Admitting Physician: Frankey Shown [[id number]]  Attending Physician: Frankey Shown [[id number]]          B Medical/Surgery History Past Medical History:  Diagnosis Date   Allergy 2016   Trazodone   Arthritis 1967   Knees   Diabetes mellitus without complication (HCC) 2019   GERD (gastroesophageal reflux disease) 2015   Sleep apnea 2015   Controlled with Tx CPAP   Past Surgical History:  Procedure Laterality Date   HERNIA REPAIR  2012   Umbilical   VASECTOMY       A IV Location/Drains/Wounds Patient Lines/Drains/Airways Status     Active Line/Drains/Airways     Name Placement date Placement time Site Days   Peripheral IV 04/29/23 20 G 1" Anterior;Distal;Right;Upper Arm 04/29/23  1655  Arm  less than 1   Peripheral IV 04/29/23 20 G Anterior;Distal;Left;Upper Arm 04/29/23  1655  Arm  less than 1   Peripheral IV 04/29/23 20 G 1"  Anterior;Distal;Left Forearm 04/29/23  1750  Forearm  less than 1            Intake/Output Last 24 hours  Intake/Output Summary (Last 24 hours) at 04/29/2023 2004 Last data filed at 04/29/2023 2004 Gross per 24 hour  Intake 100 ml  Output --  Net 100 ml    Labs/Imaging Results for orders placed or performed during the hospital encounter of 04/29/23 (from the past 48 hour(s))  CBG monitoring, ED     Status: Abnormal   Collection Time: 04/29/23  4:37 PM  Result Value Ref Range   Glucose-Capillary >600 (HH) 70 - 99 mg/dL    Comment: Glucose reference range applies only to samples taken after fasting for at least 8 hours.  Basic metabolic panel     Status: Abnormal   Collection Time: 04/29/23  4:59 PM  Result Value Ref Range   Sodium 124 (L) 135 - 145 mmol/L   Potassium 4.3 3.5 - 5.1 mmol/L   Chloride 91 (L) 98 - 111 mmol/L   CO2 22 22 - 32 mmol/L   Glucose, Bld 692 (HH) 70 - 99 mg/dL    Comment: CRITICAL RESULT CALLED TO, READ BACK BY AND VERIFIED WITH LONG, L AT 1808 ON 04/29/23 BY SMN. Glucose reference range applies only to samples taken after fasting for at least 8 hours.    BUN 22 8 - 23 mg/dL  Creatinine, Ser 1.10 0.61 - 1.24 mg/dL   Calcium 8.8 (L) 8.9 - 10.3 mg/dL   GFR, Estimated >16 >10 mL/min    Comment: (NOTE) Calculated using the CKD-EPI Creatinine Equation (2021)    Anion gap 11 5 - 15    Comment: Performed at [facility], [address] [place], [place] [address]  Beta-hydroxybutyric acid     Status: None   Collection Time: 04/29/23  4:59 PM  Result Value Ref Range   Beta-Hydroxybutyric Acid 0.15 0.05 - 0.27 mmol/L    Comment: Performed at [facility], [address], [place], [place] [address]  CBC with Differential (PNL)     Status: Abnormal   Collection Time: 04/29/23  4:59 PM  Result Value Ref Range   WBC 6.7 4.0 - 10.5 K/uL   RBC 5.73 4.22 - 5.81 MIL/uL   Hemoglobin 18.2 (H) 13.0 - 17.0 g/dL   HCT 19.1 (H) 47.8 - 29.5 %   MCV 92.7 80.0 - 100.0  fL   MCH 31.8 26.0 - 34.0 pg   MCHC 34.3 30.0 - 36.0 g/dL   RDW 62.1 30.8 - 65.7 %   Platelets 180 150 - 400 K/uL   nRBC 0.0 0.0 - 0.2 %   Neutrophils Relative % 62 %   Neutro Abs 4.1 1.7 - 7.7 K/uL   Lymphocytes Relative 25 %   Lymphs Abs 1.7 0.7 - 4.0 K/uL   Monocytes Relative 9 %   Monocytes Absolute 0.6 0.1 - 1.0 K/uL   Eosinophils Relative 3 %   Eosinophils Absolute 0.2 0.0 - 0.5 K/uL   Basophils Relative 1 %   Basophils Absolute 0.1 0.0 - 0.1 K/uL   Immature Granulocytes 0 %   Abs Immature Granulocytes 0.02 0.00 - 0.07 K/uL    Comment: Performed at [facility], [address] [place], [place] [address]  Lactic acid, plasma     Status: Abnormal   Collection Time: 04/29/23  4:59 PM  Result Value Ref Range   Lactic Acid, Venous 3.0 (HH) 0.5 - 1.9 mmol/L    Comment: CRITICAL RESULT CALLED TO, READ BACK BY AND VERIFIED WITH DELROSSO, T AT 1827 ON 04/29/23 BY SMN. Performed at [facility], [address], [place], [place] [address]   Troponin I (High Sensitivity)     Status: Abnormal   Collection Time: 04/29/23  5:29 PM  Result Value Ref Range   Troponin I (High Sensitivity) 42 (H) <18 ng/L    Comment: (NOTE) Elevated high sensitivity troponin I (hsTnI) values and significant  changes across serial measurements may suggest ACS but many other  chronic and acute conditions are known to elevate hsTnI results.  Refer to the "Links" section for chest pain algorithms and additional  guidance. Performed at [facility], [address], [place], [place] [address]   Blood gas, arterial     Status: Abnormal   Collection Time: 04/29/23  5:50 PM  Result Value Ref Range   pH, Arterial 7.47 (H) 7.35 - 7.45   pCO2 arterial 35 32 - 48 mmHg   pO2, Arterial 81 (L) 83 - 108 mmHg   Bicarbonate 25.5 20.0 - 28.0 mmol/L   Acid-Base Excess 2.1 (H) 0.0 - 2.0 mmol/L   O2 Saturation 97.8 %   Patient temperature 37.0    Collection site RIGHT RADIAL    Drawn by [id number]    Allens test (pass/fail)  PASS PASS    Comment: Performed at [facility], [address], [place], [place] [address]  Urinalysis, Routine w reflex microscopic -Urine, Clean Catch     Status: Abnormal   Collection Time: 04/29/23  6:06 PM  Result Value Ref Range   Color, Urine YELLOW YELLOW   APPearance CLEAR CLEAR   Specific Gravity, Urine <1.005 (L) 1.005 - 1.030   pH 6.0 5.0 - 8.0   Glucose, UA >=500 (A) NEGATIVE mg/dL   Hgb urine dipstick NEGATIVE NEGATIVE   Bilirubin Urine NEGATIVE NEGATIVE   Ketones, ur NEGATIVE NEGATIVE mg/dL   Protein, ur NEGATIVE NEGATIVE mg/dL   Nitrite NEGATIVE NEGATIVE   Leukocytes,Ua NEGATIVE NEGATIVE    Comment: Performed at [facility], [address], [place], [place] [address]  Urinalysis, Microscopic (reflex)     Status: None   Collection Time: 04/29/23  6:06 PM  Result Value Ref Range   RBC / HPF NONE SEEN 0 - 5 RBC/hpf   WBC, UA 0-5 0 - 5 WBC/hpf   Bacteria, UA NONE SEEN NONE SEEN   Squamous Epithelial / HPF NONE SEEN 0 - 5 /HPF    Comment: Performed at [facility], [address] [place], [place] [address]  CBG monitoring, ED     Status: Abnormal   Collection Time: 04/29/23  6:27 PM  Result Value Ref Range   Glucose-Capillary 497 (H) 70 - 99 mg/dL    Comment: Glucose reference range applies only to samples taken after fasting for at least 8 hours.  CBG monitoring, ED     Status: Abnormal   Collection Time: 04/29/23  7:22 PM  Result Value Ref Range   Glucose-Capillary 442 (H) 70 - 99 mg/dL    Comment: Glucose reference range applies only to samples taken after fasting for at least 8 hours.   DG Chest Portable 1 View  Result Date: 04/29/2023 CLINICAL DATA:  Hyperglycemia and tachycardia EXAM: PORTABLE CHEST 1 VIEW COMPARISON:  Chest radiograph dated 08/25/2020 FINDINGS: Slightly low lung volumes. No focal consolidations. No pleural effusion or pneumothorax. Enlarged cardiomediastinal silhouette is likely projectional. No acute osseous abnormality. IMPRESSION: No active  disease. Electronically Signed   By: Agustin Cree M.D.   On: 04/29/2023 18:19    Pending Labs Unresulted Labs (From admission, onward)     Start     Ordered   04/29/23 1659  Basic metabolic panel  (Diabetes Ketoacidosis (DKA))  STAT Now then every 4 hours ,   STAT      04/29/23 1658   04/29/23 1659  Beta-hydroxybutyric acid  (Diabetes Ketoacidosis (DKA))  Now then every 8 hours,   STAT (with URGENT occurrences)      04/29/23 1658   04/29/23 1659  Lactic acid, plasma  (Diabetes Ketoacidosis (DKA))  STAT Now then every 3 hours,   STAT      04/29/23 1658            Vitals/Pain Today's Vitals   04/29/23 1824 04/29/23 1826 04/29/23 1830 04/29/23 1924  BP:   (!) 144/77   Pulse: 90 90 89   Resp: 20 15 18    Temp:      TempSrc:      SpO2: 97% 96% 98%   Weight:      Height:      PainSc:    0-No pain    Isolation Precautions No active isolations  Medications Medications  insulin regular, human (MYXREDLIN) 100 units/ 100 mL infusion (18 Units/hr Intravenous New Bag/Given 04/29/23 1834)  lactated ringers infusion ( Intravenous New Bag/Given 04/29/23 1920)  dextrose 5 % in lactated ringers infusion (  0 mLs Intravenous Hold 04/29/23 1922)  dextrose 50 % solution 0-50 mL (has no administration in time range)  diltiazem (CARDIZEM) 1 mg/mL load via infusion 20 mg (20 mg Intravenous Bolus from Bag 04/29/23 1740)    And  diltiazem (CARDIZEM) 125 mg in dextrose 5% 125 mL (1 mg/mL) infusion (5 mg/hr Intravenous New Bag/Given 04/29/23 1738)  adenosine (ADENOCARD) 6 MG/2ML injection 6 mg (6 mg Intravenous Given 04/29/23 1656)  lactated ringers bolus 2,884 mL (0 mLs Intravenous Stopped 04/29/23 1918)  potassium chloride 10 mEq in 100 mL IVPB (0 mEq Intravenous Stopped 04/29/23 2004)  adenosine (ADENOCARD) 6 MG/2ML injection 12 mg (12 mg Intravenous Given 04/29/23 1725)    Mobility walks     Focused Assessments Cardiac Assessment Handoff:  Cardiac Rhythm: Atrial flutter No results found for:  "CKTOTAL", "CKMB", "CKMBINDEX", "TROPONINI" No results found for: "DDIMER" Does the Patient currently have chest pain? No    R Recommendations: See Admitting Provider Note  Report given to:   Additional Notes: a flutter

## 2023-04-29 NOTE — H&P (Signed)
History and Physical    Patient: David Hammond XBJ:[id number] DOB: 27-Nov-1961 DOA: 04/29/2023 DOS: the patient was seen and examined on 04/29/2023 PCP: Shawnie Dapper, PA-C  Patient coming from: Home  Chief Complaint:  Chief Complaint  Patient presents with   Hyperglycemia   HPI: David Hammond is a 62 y.o. male with medical history significant of type 2 diabetes mellitus, hyperlipidemia, obesity who presents to the emergency department due to dry mouth and lightheadedness which started this morning.  Patient states that he was off his diabetes medication for about 3-4 months and was just restarted a few weeks ago.  He works as a Investment banker, operational for [employer] and has been traveling for about a week with little sleep and increased stress.  He woke up this morning feeling like his blood glucose level was elevated, he states that his mouth was dry and he felt lightheaded, so he presented to the ED for further evaluation and management.  ED Course:  In the emergency department, respiratory rate was 24/min, pulse was 69 bpm BP was 146/128, temperature 98.2, O2 sat 95% on room air.  Workup in ED showed sodium 124, potassium 4.3, chloride 91, bicarb 22, glucose 692, BUN 22, creatinine 1.10, GFR greater than 60, anion gap 11.  Lactic acid 3.0, urinalysis positive for glycosuria. Chest x-ray showed no active disease. Initial EKG personally reviewed showed sinus tachycardia at a rate of 169 bpm with prolonged QTc . Patient was treated with IV adenosine and was started on Cardizem drip after initiation IV 20 mg of Cardizem due to patient going into atrial flutter with rate control.  IV hydration was provided and patient was started on insulin drip due to hyperglycemia.  Hospitalist was asked to admit patient for further evaluation and management.  Review of Systems: Review of systems as noted in the HPI. All other systems reviewed and are negative.   Past Medical History:  Diagnosis Date    Allergy 2016   Trazodone   Arthritis 1967   Knees   Diabetes mellitus without complication (HCC) 2019   GERD (gastroesophageal reflux disease) 2015   Hypercholesterolemia 04/29/2023   Sleep apnea 2015   Controlled with Tx CPAP   Past Surgical History:  Procedure Laterality Date   HERNIA REPAIR  2012   Umbilical   VASECTOMY      Social History:  reports that he has been smoking pipe and cigarettes. He has never used smokeless tobacco. He reports current alcohol use. He reports that he does not use drugs.   Allergies  Allergen Reactions   Trazodone Palpitations and Shortness Of Breath    Family History  Problem Relation Age of Onset   Arthritis Father    Cancer Father    COPD Father    Diabetes Father    Heart disease Father    Hyperlipidemia Father    Hypertension Father    Kidney disease Father    Stroke Father    Arthritis Mother    Varicose Veins Mother      Prior to Admission medications   Medication Sig Start Date End Date Taking? Authorizing Provider  Dulaglutide (TRULICITY) 1.5 MG/0.5ML SOPN Inject 1.5 mg into the skin once a week. 04/09/23  Yes Del Newman Nip, Tenna Child, FNP  fenofibrate (TRICOR) 48 MG tablet Take 48 mg by mouth daily. 04/11/23  Yes [provider]  naproxen sodium (ALEVE) 220 MG tablet Take 440 mg by mouth daily as needed (pain).   Yes [provider]  testosterone cypionate (DEPOTESTOSTERONE CYPIONATE) 200 MG/ML injection Inject 0.89mL into muscle every 2 weeks. Use 18 gauge to draw up and 23 gauge to administer. Patient taking differently: 0.5 mLs every 7 (seven) days.  Use 18 gauge to draw up and 23 gauge to administer. 01/14/20  Yes Roma Kayser, MD  glimepiride (AMARYL) 2 MG tablet Take 1 tablet (2 mg total) by mouth daily before breakfast. Patient not taking: Reported on 04/29/2023 04/09/23   Rica Records, FNP  Needles & Syringes MISC  12/11/08   [provider]  Needles & Syringes MISC  12/11/08    [provider]  omega-3 acid ethyl esters (LOVAZA) 1 g capsule Take by mouth. 04/14/23 04/08/24  [provider]  SYRINGE/NEEDLE, DISP, 1 ML 23G X 1" 1 ML MISC Use to administer testosterone 01/14/20   Nida, Denman Requan, MD  Syringe/Needle, Disp, 18G X 1" 1 ML MISC Use to draw up testosterone 01/14/20   Nida, Denman Javian, MD  tiZANidine (ZANAFLEX) 4 MG capsule Take 1 capsule (4 mg total) by mouth 3 (three) times daily as needed for muscle spasms. Patient not taking: Reported on 04/05/2023 01/29/23   Kathryne Hitch, MD    Physical Exam: BP 122/77   Pulse 93   Temp 98.2 F (36.8 C) (Oral)   Resp 16   Ht 6' (1.829 m)   Wt (!) 144.2 kg   SpO2 98%   BMI 43.13 kg/m   General: 62 y.o. year-old male well developed well nourished in no acute distress.  Alert and oriented x3. HEENT: NCAT, EOMI, dry mucous membrane Neck: Supple, trachea medial Cardiovascular: Regular rate and rhythm with no rubs or gallops.  No thyromegaly or JVD noted.  Bilateral trace lower extremity edema. 2/4 pulses in all 4 extremities. Respiratory: Clear to auscultation with no wheezes or rales. Good inspiratory effort. Abdomen: Soft, nontender nondistended with normal bowel sounds x4 quadrants. Muskuloskeletal: No cyanosis, clubbing noted bilaterally Neuro: CN II-XII intact, strength 5/5 x 4, sensation, reflexes intact Skin: No ulcerative lesions noted or rashes Psychiatry: Judgement and insight appear normal. Mood is appropriate for condition and setting          Labs on Admission:  Basic Metabolic Panel: Recent Labs  Lab 04/29/23 1659 04/29/23 2028  NA 124* 132*  K 4.3 3.5  CL 91* 98  CO2 22 24  GLUCOSE 692* 240*  BUN 22 19  CREATININE 1.10 0.85  CALCIUM 8.8* 8.8*   Liver Function Tests: No results for input(s): "AST", "ALT", "ALKPHOS", "BILITOT", "PROT", "ALBUMIN" in the last 168 hours. No results for input(s): "LIPASE", "AMYLASE" in the last 168 hours. No results for  input(s): "AMMONIA" in the last 168 hours. CBC: Recent Labs  Lab 04/29/23 1659  WBC 6.7  NEUTROABS 4.1  HGB 18.2*  HCT 53.1*  MCV 92.7  PLT 180   Cardiac Enzymes: No results for input(s): "CKTOTAL", "CKMB", "CKMBINDEX", "TROPONINI" in the last 168 hours.  BNP (last 3 results) No results for input(s): "BNP" in the last 8760 hours.  ProBNP (last 3 results) No results for input(s): "PROBNP" in the last 8760 hours.  CBG: Recent Labs  Lab 04/29/23 1637 04/29/23 1827 04/29/23 1922 04/29/23 2015  GLUCAP >600* 497* 442* 223*    Radiological Exams on Admission: DG Chest Portable 1 View  Result Date: 04/29/2023 CLINICAL DATA:  Hyperglycemia and tachycardia EXAM: PORTABLE CHEST 1 VIEW COMPARISON:  Chest radiograph dated 08/25/2020 FINDINGS: Slightly low lung volumes. No focal consolidations. No pleural  effusion or pneumothorax. Enlarged cardiomediastinal silhouette is likely projectional. No acute osseous abnormality. IMPRESSION: No active disease. Electronically Signed   By: Agustin Cree M.D.   On: 04/29/2023 18:19    EKG: I independently viewed the EKG done and my findings are as followed:  Initial EKG  showed sinus tachycardia at a rate of 169 bpm with prolonged QTc . Subsequent EKG personally reviewed showed atrial fibrillation with rate control with QTc of 515 ms  Assessment/Plan Present on Admission:  Hyperglycemia  Principal Problem:   Hyperglycemia Active Problems:   Hyperosmolar hyperglycemic state (HHS) (HCC)   Paroxysmal atrial flutter (HCC)   Prolonged QT interval   Pseudohyponatremia   Lactic acidosis   Dehydration   Hypercholesterolemia   Obesity, Class III, BMI 40-49.9 (morbid obesity) (HCC)   Hyperglycemia secondary to HHS Continue insulin drip, IV LR with IV potassium per DKA protocol Transition IV LR to D5 LR when serum glucose reaches 250mg /dL Continue serial BMP and VBG Continue NPO Trulicity and glimepiride will be held at this  time  Paroxysmal atrial flutter Continue IV Cardizem drip with plan to transition patient to oral rate control in the morning Continue palpitate Lovenox with plan to transition patient to oral DOAC in the morning  Prolonged QT Interval QTc   515 ms Avoid QT prolonging drugs Magnesium level will be checked Repeat EKG in the morning  Pseudohyponatremia Na 124, corrected sodium level based on CBG of 692 equals 133 Continue IV hydration and continue to monitor sodium levels with morning labs  Lactic acidosis possibly due to dehydration Lactic acid 3.0 > 2.6 Continue IV hydration and continue to trend lactic acid  Dehydration Continue IV hydration  Diabetic neuropathy Continue pregabalin  Hypercholesterolemia Continue fenofibrate, Lovaza  Morbid obesity (BMI 43.13) Diet and lifestyle modification  DVT prophylaxis: Lovenox  Advance Care Planning: Full code  Consults: None  Family Communication: None at bedside  Severity of Illness: The appropriate patient status for this patient is OBSERVATION. Observation status is judged to be reasonable and necessary in order to provide the required intensity of service to ensure the patient's safety. The patient's presenting symptoms, physical exam findings, and initial radiographic and laboratory data in the context of their medical condition is felt to place them at decreased risk for further clinical deterioration. Furthermore, it is anticipated that the patient will be medically stable for discharge from the hospital within 2 midnights of admission.   Author: Frankey Shown, DO 04/29/2023 9:36 PM  For on call review [email or web address].

## 2023-04-30 ENCOUNTER — Observation Stay (HOSPITAL_COMMUNITY): Payer: Federal, State, Local not specified - PPO

## 2023-04-30 ENCOUNTER — Other Ambulatory Visit (HOSPITAL_COMMUNITY): Payer: Self-pay | Admitting: *Deleted

## 2023-04-30 DIAGNOSIS — R739 Hyperglycemia, unspecified: Secondary | ICD-10-CM | POA: Diagnosis not present

## 2023-04-30 DIAGNOSIS — R9431 Abnormal electrocardiogram [ECG] [EKG]: Secondary | ICD-10-CM

## 2023-04-30 LAB — BASIC METABOLIC PANEL
Anion gap: 11 (ref 5–15)
Anion gap: 8 (ref 5–15)
Anion gap: 8 (ref 5–15)
BUN: 17 mg/dL (ref 8–23)
BUN: 15 mg/dL (ref 8–23)
BUN: 14 mg/dL (ref 8–23)
CO2: 25 mmol/L (ref 22–32)
CO2: 26 mmol/L (ref 22–32)
CO2: 24 mmol/L (ref 22–32)
Calcium: 8.1 mg/dL — ABNORMAL LOW (ref 8.9–10.3)
Calcium: 8.1 mg/dL — ABNORMAL LOW (ref 8.9–10.3)
Calcium: 8 mg/dL — ABNORMAL LOW (ref 8.9–10.3)
Chloride: 96 mmol/L — ABNORMAL LOW (ref 98–111)
Chloride: 98 mmol/L (ref 98–111)
Chloride: 98 mmol/L (ref 98–111)
Creatinine, Ser: 0.79 mg/dL (ref 0.61–1.24)
Creatinine, Ser: 0.74 mg/dL (ref 0.61–1.24)
Creatinine, Ser: 0.65 mg/dL (ref 0.61–1.24)
GFR, Estimated: >60 mL/min (ref >60)
GFR, Estimated: >60 mL/min (ref >60)
GFR, Estimated: >60 mL/min (ref >60)
Glucose, Bld: 198 mg/dL — ABNORMAL HIGH (ref 70–99)
Glucose, Bld: 157 mg/dL — ABNORMAL HIGH (ref 70–99)
Glucose, Bld: 165 mg/dL — ABNORMAL HIGH (ref 70–99)
Potassium: 3.1 mmol/L — ABNORMAL LOW (ref 3.5–5.1)
Potassium: 3.1 mmol/L — ABNORMAL LOW (ref 3.5–5.1)
Potassium: 3.5 mmol/L (ref 3.5–5.1)
Sodium: 132 mmol/L — ABNORMAL LOW (ref 135–145)
Sodium: 132 mmol/L — ABNORMAL LOW (ref 135–145)
Sodium: 130 mmol/L — ABNORMAL LOW (ref 135–145)

## 2023-04-30 LAB — CBC
HCT: 50 % (ref 39.0–52.0)
Hemoglobin: 17.1 g/dL — ABNORMAL HIGH (ref 13.0–17.0)
MCH: 32 pg (ref 26.0–34.0)
MCHC: 34.2 g/dL (ref 30.0–36.0)
MCV: 93.6 fL (ref 80.0–100.0)
Platelets: 168 10*3/uL (ref 150–400)
RBC: 5.34 MIL/uL (ref 4.22–5.81)
RDW: 12.3 % (ref 11.5–15.5)
WBC: 7.2 10*3/uL (ref 4.0–10.5)
nRBC: 0 % (ref 0.0–0.2)

## 2023-04-30 LAB — ECHOCARDIOGRAM COMPLETE
AR max vel: 1.74 cm2
AV Area VTI: 1.81 cm2
AV Area mean vel: 1.76 cm2
AV Mean grad: 4.6 mmHg
AV Peak grad: 9 mmHg
Ao pk vel: 1.5 m/s
Area-P 1/2: 5.88 cm2
Height: 72 in
S' Lateral: 3.9 cm
Weight: 5088 oz

## 2023-04-30 LAB — MAGNESIUM: Magnesium: 1.7 mg/dL (ref 1.7–2.4)

## 2023-04-30 LAB — GLUCOSE, CAPILLARY
Glucose-Capillary: 153 mg/dL — ABNORMAL HIGH (ref 70–99)
Glucose-Capillary: 157 mg/dL — ABNORMAL HIGH (ref 70–99)
Glucose-Capillary: 166 mg/dL — ABNORMAL HIGH (ref 70–99)
Glucose-Capillary: 174 mg/dL — ABNORMAL HIGH (ref 70–99)
Glucose-Capillary: 177 mg/dL — ABNORMAL HIGH (ref 70–99)
Glucose-Capillary: 178 mg/dL — ABNORMAL HIGH (ref 70–99)
Glucose-Capillary: 186 mg/dL — ABNORMAL HIGH (ref 70–99)
Glucose-Capillary: 189 mg/dL — ABNORMAL HIGH (ref 70–99)
Glucose-Capillary: 193 mg/dL — ABNORMAL HIGH (ref 70–99)
Glucose-Capillary: 214 mg/dL — ABNORMAL HIGH (ref 70–99)
Glucose-Capillary: 227 mg/dL — ABNORMAL HIGH (ref 70–99)
Glucose-Capillary: 234 mg/dL — ABNORMAL HIGH (ref 70–99)
Glucose-Capillary: 262 mg/dL — ABNORMAL HIGH (ref 70–99)
Glucose-Capillary: 315 mg/dL — ABNORMAL HIGH (ref 70–99)

## 2023-04-30 LAB — PHOSPHORUS: Phosphorus: 3.7 mg/dL (ref 2.5–4.6)

## 2023-04-30 LAB — BETA-HYDROXYBUTYRIC ACID
Beta-Hydroxybutyric Acid: 0.24 mmol/L (ref 0.05–0.27)
Beta-Hydroxybutyric Acid: 0.22 mmol/L (ref 0.05–0.27)

## 2023-04-30 LAB — TSH: TSH: 1.309 u[IU]/mL (ref 0.350–4.500)

## 2023-04-30 MED ORDER — MAGNESIUM SULFATE 2 GM/50ML IV SOLN
2.0000 g | Freq: Once | INTRAVENOUS | Status: AC
  Administered 2023-04-30: 2 g via INTRAVENOUS
  Filled 2023-04-30: qty 50

## 2023-04-30 MED ORDER — PERFLUTREN LIPID MICROSPHERE
1.0000 mL | INTRAVENOUS | Status: AC | PRN
  Administered 2023-04-30: 4 mL via INTRAVENOUS

## 2023-04-30 MED ORDER — INSULIN GLARGINE-YFGN 100 UNIT/ML ~~LOC~~ SOLN
10.0000 [IU] | Freq: Every day | SUBCUTANEOUS | Status: DC
  Filled 2023-04-30 (×2): qty 0.1

## 2023-04-30 MED ORDER — INSULIN ASPART 100 UNIT/ML IJ SOLN
0.0000 [IU] | Freq: Every day | INTRAMUSCULAR | Status: DC
  Administered 2023-04-30: 4 [IU] via SUBCUTANEOUS
  Administered 2023-05-01: 2 [IU] via SUBCUTANEOUS

## 2023-04-30 MED ORDER — NICOTINE 21 MG/24HR TD PT24
21.0000 mg | MEDICATED_PATCH | Freq: Every day | TRANSDERMAL | Status: DC
  Administered 2023-04-30 – 2023-05-02 (×3): 21 mg via TRANSDERMAL
  Filled 2023-04-30 (×3): qty 1

## 2023-04-30 MED ORDER — INSULIN ASPART 100 UNIT/ML IJ SOLN
3.0000 [IU] | Freq: Three times a day (TID) | INTRAMUSCULAR | Status: DC
  Administered 2023-04-30 – 2023-05-02 (×7): 3 [IU] via SUBCUTANEOUS

## 2023-04-30 MED ORDER — METOPROLOL TARTRATE 25 MG PO TABS
25.0000 mg | ORAL_TABLET | Freq: Four times a day (QID) | ORAL | Status: DC
  Administered 2023-04-30 – 2023-05-02 (×8): 25 mg via ORAL
  Filled 2023-04-30 (×8): qty 1

## 2023-04-30 MED ORDER — POTASSIUM CHLORIDE CRYS ER 20 MEQ PO TBCR
40.0000 meq | EXTENDED_RELEASE_TABLET | Freq: Once | ORAL | Status: AC
  Administered 2023-04-30: 40 meq via ORAL
  Filled 2023-04-30: qty 2

## 2023-04-30 MED ORDER — INSULIN GLARGINE-YFGN 100 UNIT/ML ~~LOC~~ SOLN
12.0000 [IU] | Freq: Two times a day (BID) | SUBCUTANEOUS | Status: DC
  Administered 2023-04-30 – 2023-05-01 (×3): 12 [IU] via SUBCUTANEOUS
  Filled 2023-04-30 (×5): qty 0.12

## 2023-04-30 MED ORDER — CHLORHEXIDINE GLUCONATE CLOTH 2 % EX PADS
6.0000 | MEDICATED_PAD | Freq: Every day | CUTANEOUS | Status: DC
  Administered 2023-04-30 – 2023-05-02 (×3): 6 via TOPICAL

## 2023-04-30 MED ORDER — INSULIN ASPART 100 UNIT/ML IJ SOLN
0.0000 [IU] | Freq: Three times a day (TID) | INTRAMUSCULAR | Status: DC
  Administered 2023-04-30: 2 [IU] via SUBCUTANEOUS
  Administered 2023-04-30: 3 [IU] via SUBCUTANEOUS
  Administered 2023-05-01: 7 [IU] via SUBCUTANEOUS
  Administered 2023-05-01 (×2): 5 [IU] via SUBCUTANEOUS
  Administered 2023-05-02: 2 [IU] via SUBCUTANEOUS

## 2023-04-30 MED ORDER — METOPROLOL TARTRATE 25 MG PO TABS: 25.0000 mg | ORAL_TABLET | Freq: Two times a day (BID) | ORAL | Status: DC

## 2023-04-30 NOTE — Progress Notes (Signed)
Patient awake and alert, transitioned from endo tool @ 12pm, heart healthy diet ordered, consumed and tolerated well. Patient resting quietly, required nicotine patch, ordered and applied. Cardizem gtt continues, heart rhythm A-flutter no SOB or distress, no others concerns voiced at this time.

## 2023-04-30 NOTE — Progress Notes (Addendum)
PROGRESS NOTE     David Hammond, is a 62 y.o. male, DOB - [birth year], EAV:[id number]  Admit date - 04/29/2023   Admitting Physician Frankey Shown, DO  Outpatient Primary MD for the patient is David Hammond  LOS - 0  Chief Complaint  Patient presents with   Hyperglycemia      Brief Narrative:  62 y.o. male with medical history significant of type 2 diabetes mellitus, hyperlipidemia, obesity admitted on 04/28/2021 for with HHS and  atrial flutter with RVR    -Assessment and Plan: 1)Hyperglycemia secondary to HHS/uncontrolled diabetes with persistent hyperglycemia- -recent A1c was 8.4 -Did Not meet criteria for DKA -Did well with IV insulin and IV fluids -Currently transitioned to subcu insulin -Prior to admission patient was on Trulicity monotherapy but he ran out of Trulicity for almost 3 weeks prior to restarting it about 2 weeks ago -Diabetic educator input appreciated   2)Paroxysmal Atrial flutter--with AVR new onset -Currently on IV Cardizem -Given low EF will not be prudent to transition to oral Cardizem -Start p.o. metoprolol--with plans to transition off IV Cardizem in the a.m. if rate control is better - CHA2DS2- VASc score   is = 2  (DM2/CHF)   Which is  equal to = 2.2 % annual risk of stroke  This patients CHA2DS2-VASc Score and unadjusted Ischemic Stroke Rate (% per year) is equal to 2.2 % stroke rate/year from a score of 2 -- Continue subcu Lovenox for now with plans to transition to DOAC at discharge  3)HFrEF--newly diagnosed systolic dysfunction CHF with EF 35 to 40 %--this may be tachycardia mediated in the setting of atrial flutter with RVR -No mitral stenosis and no aortic stenosis -TSH WNL -CT coronary with calcium score back in May 2021 was unremarkable with a 0 score and no evidence of CAD at the time -If EF remains low with repeat echo in 3 to 4 months after heart rate control may consider LHC to rule out ischemic component -Metoprolol as above  #2   4)Prolonged QT Interval QTc   515 ms Avoid QT prolonging drugs Magnesium 1.7--give magnesium to keep serum magnesium close to 2 -Hypokalemia noted--- replace potassium and keep potassium close to 4   5)Pseudohyponatremia Na 124, corrected sodium level based on CBG of 692 equals 133 Continue IV hydration and continue to monitor sodium levels with morning labs   Lactic acidosis possibly due to dehydration Lactic acid 3.0 > 2.6 Continue IV hydration and continue to trend lactic acid    Diabetic neuropathy Continue pregabalin   Hypercholesterolemia Continue fenofibrate, Lovaza   /Morbid Obesity- -Low calorie diet, portion control and increase physical activity discussed with patient -Body mass index is 43.13 kg/m.   Disposition: The patient is from: Home              Anticipated d/c is to: Home              Anticipated d/c date is: 1 day              Patient currently is not medically stable to d/c. Barriers: Not Clinically Stable-   Code Status :  -  Code Status: Full Code   Family Communication:    (patient is alert, awake and coherent)  Discussed with wife at bedside  DVT Prophylaxis  :   - SCDs /Lovenox/  SCDs Start: 04/29/23 2119   Lab Results  Component Value Date   PLT 168 04/30/2023    Inpatient Medications  Scheduled  Meds:  Chlorhexidine Gluconate Cloth  6 each Topical Daily   enoxaparin (LOVENOX) injection  140 mg Subcutaneous Q12H   fenofibrate  54 mg Oral Daily   insulin aspart  0-5 Units Subcutaneous QHS   insulin aspart  0-9 Units Subcutaneous TID WC   insulin aspart  3 Units Subcutaneous TID WC   insulin glargine-yfgn  12 Units Subcutaneous BID   nicotine  21 mg Transdermal Daily   omega-3 acid ethyl esters  1 g Oral Daily   pregabalin  75 mg Oral BID   Continuous Infusions:  dextrose 5% lactated ringers Stopped (04/30/23 1736)   diltiazem (CARDIZEM) infusion 10 mg/hr (04/30/23 1838)   insulin Stopped (04/30/23 1156)   lactated ringers  Stopped (04/29/23 2018)   PRN Meds:.acetaminophen **OR** acetaminophen, dextrose, prochlorperazine   Anti-infectives (From admission, onward)    None         Subjective: [name] today has no fevers, no emesis,  No chest pain,   - Fatigue and generalized weakness noted -Wife at bedside, questions answered -Ambulated without dyspnea on exertion--- with ambulation on IV Cardizem at 10 heart rate went up to between 120 and 140bpm   Objective: Vitals:   04/30/23 1600 04/30/23 1618 04/30/23 1700 04/30/23 1800  BP: 125/88  133/71 112/87  Pulse:      Resp: (!) 25  19 20   Temp:  98.2 F (36.8 C)    TempSrc:  Oral    SpO2: 96%  97% 96%  Weight:      Height:        Intake/Output Summary (Last 24 hours) at 04/30/2023 1852 Last data filed at 04/30/2023 1838 Gross per 24 hour  Intake 2551.43 ml  Output 1750 ml  Net 801.43 ml   Filed Weights   04/29/23 1638 04/29/23 1647  Weight: (!) 144.2 kg (!) 144.2 kg    Physical Exam  Gen:- Awake Alert, obese, in no acute distress HEENT:- [place].AT, No sclera icterus Neck-Supple Neck,No JVD,.  Lungs-  CTAB , fair symmetrical air movement CV- S1, S2 normal, irregularly irregular and tachycardic Abd-  +ve B.Sounds, Abd Soft, No tenderness,    Extremity/Skin:- No  edema, pedal pulses present  Psych-affect is appropriate, oriented x3 Neuro-no new focal deficits, no tremors  Data Reviewed: I have personally reviewed following labs and imaging studies  CBC: Recent Labs  Lab 04/29/23 1659 04/30/23 0453  WBC 6.7 7.2  NEUTROABS 4.1  --   HGB 18.2* 17.1*  HCT 53.1* 50.0  MCV 92.7 93.6  PLT 180 168   Basic Metabolic Panel: Recent Labs  Lab 04/29/23 1659 04/29/23 2028 04/30/23 0118 04/30/23 0453 04/30/23 0832  NA 124* 132* 132* 132* 130*  K 4.3 3.5 3.1* 3.1* 3.5  CL 91* 98 96* 98 98  CO2 22 24 25 26 24   GLUCOSE 692* 240* 198* 157* 165*  BUN 22 19 17 15 14   CREATININE 1.10 0.85 0.79 0.74 0.65  CALCIUM 8.8* 8.8* 8.1*  8.1* 8.0*  MG  --   --   --  1.7  --   PHOS  --   --   --  3.7  --    GFR: Estimated Creatinine Clearance: 142.9 mL/min (by C-G formula based on SCr of 0.65 mg/dL).  Recent Results (from the past 240 hour(s))  MRSA Next Gen by PCR, Nasal     Status: None   Collection Time: 04/29/23 10:24 PM   Specimen: Nasal Mucosa; Nasal Swab  Result Value Ref Range Status  MRSA by PCR Next Gen NOT DETECTED NOT DETECTED Final    Comment: (NOTE) The GeneXpert MRSA Assay (FDA approved for NASAL specimens only), is one component of a comprehensive MRSA colonization surveillance program. It is not intended to diagnose MRSA infection nor to guide or monitor treatment for MRSA infections. Test performance is not FDA approved in patients less than 62 years old. Performed at [facility], [address], [place], [place] [address]     Radiology Studies: ECHOCARDIOGRAM COMPLETE  Result Date: 04/30/2023    ECHOCARDIOGRAM REPORT   Patient Name:   David Hammond Date of Exam: 04/30/2023 Medical Rec #:  [id number]     Height:       72.0 in Accession #:    [id number]    Weight:       318.0 lb Date of Birth:  June 19, 1961      BSA:          2.595 m Patient Age:    62 years      BP:           156/92 mmHg Patient Gender: M             HR:           57 bpm. [facility] Procedure: 2D Echo, Color Doppler, Cardiac Doppler and Intracardiac            Opacification Agent Indications:    Abnormal ECG R94.31  History:        Patient has no prior history of Echocardiogram examinations.                 Abnormal ECG, Arrythmias:Atrial Fibrillation; Risk                 Factors:Dyslipidemia, Current Smoker, Diabetes and Sleep Apnea.  Sonographer:    Aron Baba Referring Phys: [id number] Dragan Tamburrino  Sonographer Comments: Patient is obese and Technically difficult study due to poor echo windows. IMPRESSIONS  1. Left ventricular ejection fraction, by estimation, is 35 to 40%. The left ventricle has moderately decreased  function. Left ventricular endocardial border not optimally defined to evaluate regional wall motion. not well visualized left ventricular hypertrophy. Left ventricular diastolic parameters are indeterminate.  2. RV not well visualized, grossly appears normal in size and function. . Right ventricular systolic function was not well visualized. The right ventricular size is not well visualized.  3. The mitral valve is normal in structure. Trivial mitral valve regurgitation. No evidence of mitral stenosis.  4. The aortic valve was not well visualized. Aortic valve regurgitation is not visualized. No aortic stenosis is present.  5. Technically difficult study, limited visualization even with echo contrast. FINDINGS  Left Ventricle: Left ventricular ejection fraction, by estimation, is 35 to 40%. The left ventricle has moderately decreased function. Left ventricular endocardial border not optimally defined to evaluate regional wall motion. Definity contrast agent was given IV to delineate the left ventricular endocardial borders. The left ventricular internal cavity size was normal in size. Not well visualized left ventricular hypertrophy. Left ventricular diastolic parameters are indeterminate. Right Ventricle: RV not well visualized, grossly appears normal in size and function. The right ventricular size is not well visualized. Right vetricular wall thickness was not well visualized. Right ventricular systolic function was not well visualized. Left Atrium: Left atrial size was normal in size. Right Atrium: Right atrial size was normal in size. Pericardium: The pericardium was not well visualized. Mitral Valve: The mitral valve is normal in  structure. Trivial mitral valve regurgitation. No evidence of mitral valve stenosis. Tricuspid Valve: The tricuspid valve is normal in structure. Tricuspid valve regurgitation is not demonstrated. No evidence of tricuspid stenosis. Aortic Valve: The aortic valve was not well  visualized. Aortic valve regurgitation is not visualized. No aortic stenosis is present. Aortic valve mean gradient measures 4.6 mmHg. Aortic valve peak gradient measures 9.0 mmHg. Aortic valve area, by VTI measures 1.81 cm. Pulmonic Valve: The pulmonic valve was not well visualized. Pulmonic valve regurgitation is not visualized. No evidence of pulmonic stenosis. Aorta: The aortic root is normal in size and structure. Venous: The inferior vena cava was not well visualized. IAS/Shunts: The interatrial septum was not well visualized.  LEFT VENTRICLE PLAX 2D LVIDd:         4.50 cm   Diastology LVIDs:         3.90 cm   LV e' medial:    10.10 cm/s LV PW:         1.30 cm   LV E/e' medial:  11.0 LV IVS:        1.40 cm   LV e' lateral:   19.00 cm/s LVOT diam:     1.90 cm   LV E/e' lateral: 5.8 LV SV:         39 LV SV Index:   15 LVOT Area:     2.84 cm  RIGHT VENTRICLE RV S prime:     10.60 cm/s TAPSE (M-mode): 2.2 cm LEFT ATRIUM             Index        RIGHT ATRIUM           Index LA diam:        3.60 cm 1.39 cm/m   RA Area:     24.10 cm LA Vol (A2C):   56.5 ml 21.78 ml/m  RA Volume:   70.00 ml  26.98 ml/m LA Vol (A4C):   53.5 ml 20.62 ml/m LA Biplane Vol: 57.7 ml 22.24 ml/m  AORTIC VALVE AV Area (Vmax):    1.74 cm AV Area (Vmean):   1.76 cm AV Area (VTI):     1.81 cm AV Vmax:           150.29 cm/s AV Vmean:          100.377 cm/s AV VTI:            0.215 m AV Peak Grad:      9.0 mmHg AV Mean Grad:      4.6 mmHg LVOT Vmax:         92.03 cm/s LVOT Vmean:        62.300 cm/s LVOT VTI:          0.137 m LVOT/AV VTI ratio: 0.64  AORTA Ao Root diam: 3.50 cm Ao Asc diam:  3.30 cm MITRAL VALVE MV Area (PHT): 5.88 cm     SHUNTS MV Decel Time: 129 msec     Systemic VTI:  0.14 m MV E velocity: 111.00 cm/s  Systemic Diam: 1.90 cm MV A velocity: 42.00 cm/s MV E/A ratio:  2.64 Dina Rich MD Electronically signed by Dina Rich MD Signature Date/Time: 04/30/2023/4:06:57 PM    Final    DG Chest Portable 1  View  Result Date: 04/29/2023 CLINICAL DATA:  Hyperglycemia and tachycardia EXAM: PORTABLE CHEST 1 VIEW COMPARISON:  Chest radiograph dated 08/25/2020 FINDINGS: Slightly low lung volumes. No focal consolidations. No pleural effusion or pneumothorax. Enlarged cardiomediastinal silhouette is  likely projectional. No acute osseous abnormality. IMPRESSION: No active disease. Electronically Signed   By: Agustin Cree M.D.   On: 04/29/2023 18:19    Scheduled Meds:  Chlorhexidine Gluconate Cloth  6 each Topical Daily   enoxaparin (LOVENOX) injection  140 mg Subcutaneous Q12H   fenofibrate  54 mg Oral Daily   insulin aspart  0-5 Units Subcutaneous QHS   insulin aspart  0-9 Units Subcutaneous TID WC   insulin aspart  3 Units Subcutaneous TID WC   insulin glargine-yfgn  12 Units Subcutaneous BID   nicotine  21 mg Transdermal Daily   omega-3 acid ethyl esters  1 g Oral Daily   pregabalin  75 mg Oral BID   Continuous Infusions:  dextrose 5% lactated ringers Stopped (04/30/23 1736)   diltiazem (CARDIZEM) infusion 10 mg/hr (04/30/23 1838)   insulin Stopped (04/30/23 1156)   lactated ringers Stopped (04/29/23 2018)    LOS: 0 days   Shon Hale M.D on 04/30/2023 at 6:52 PM  Go to [email or web address] - for contact info  [facility] - Office  [phone]  If 7PM-7AM, please contact night-coverage [email or web address] 04/30/2023, 6:52 PM

## 2023-04-30 NOTE — Plan of Care (Signed)

## 2023-04-30 NOTE — TOC Progression Note (Signed)
  Transition of Care [facility]) Screening Note   Patient Details  Name: David Hammond Date of Birth: August 02, 1961   Transition of [facility]) CM/SW Contact:    Leitha Bleak, RN Phone Number: 04/30/2023, 9:56 AM    Transition of Care Department [facility]) has reviewed patient and no TOC needs have been identified at this time. We will continue to monitor patient advancement through interdisciplinary progression rounds. If new patient transition needs arise, please place a TOC consult.     Barriers to Discharge: Continued Medical Work up  Expected Discharge Plan and Services     Living arrangements for the past 2 months: Single Family Home          Social Determinants of Health (SDOH) Interventions SDOH Screenings   Food Insecurity: No Food Insecurity (04/30/2023)  Housing: Low Risk  (04/30/2023)  Transportation Needs: No Transportation Needs (04/30/2023)  Utilities: Not At Risk (04/30/2023)  Depression (PHQ2-9): Low Risk  (04/05/2023)  Financial Resource Strain: Low Risk  (06/03/2019)  Physical Activity: Unknown (06/03/2019)  Social Connections: Unknown (06/03/2019)  Stress: No Stress Concern Present (06/03/2019)  Tobacco Use: High Risk (04/29/2023)    Readmission Risk Interventions     No data to display

## 2023-04-30 NOTE — Progress Notes (Signed)
  Echocardiogram 2D Echocardiogram has been performed.  Maren Reamer 04/30/2023, 3:56 PM

## 2023-04-30 NOTE — Inpatient Diabetes Management (Signed)
Inpatient Diabetes Program Recommendations  AACE/ADA: New Consensus Statement on Inpatient Glycemic Control (2015)  Target Ranges:  Prepandial:   less than 140 mg/dL      Peak postprandial:   less than 180 mg/dL (1-2 hours)      Critically ill patients:  140 - 180 mg/dL   Lab Results  Component Value Date   GLUCAP 153 (H) 04/30/2023   HGBA1C 8.4 (H) 04/06/2023    Review of Glycemic Control  Diabetes history: DM2 Outpatient Diabetes medications: Trulicity 1.5 weekly Amaryl 2 mg QD-Not taking Current orders for Inpatient glycemic control: IV insulin  Inpatient Diabetes Program Recommendations:    When MD is ready to transition off of IV insulin, please consider:  Semglee 25 units 2 hrs prior to discontinuing IV insulin, then daily Novolog 0-15 units TID and 0-5 units QHS  Will continue to follow while inpatient.  Thank you, Dulce Sellar, MSN, CDCES Diabetes Coordinator Inpatient Diabetes Program [phone] (team pager from 8a-5p)

## 2023-05-01 ENCOUNTER — Other Ambulatory Visit (HOSPITAL_COMMUNITY): Payer: Self-pay

## 2023-05-01 ENCOUNTER — Telehealth (HOSPITAL_COMMUNITY): Payer: Self-pay | Admitting: Pharmacy Technician

## 2023-05-01 DIAGNOSIS — I110 Hypertensive heart disease with heart failure: Secondary | ICD-10-CM | POA: Diagnosis present

## 2023-05-01 DIAGNOSIS — I5020 Unspecified systolic (congestive) heart failure: Secondary | ICD-10-CM

## 2023-05-01 DIAGNOSIS — Z8261 Family history of arthritis: Secondary | ICD-10-CM | POA: Diagnosis not present

## 2023-05-01 DIAGNOSIS — G4733 Obstructive sleep apnea (adult) (pediatric): Secondary | ICD-10-CM

## 2023-05-01 DIAGNOSIS — Z885 Allergy status to narcotic agent status: Secondary | ICD-10-CM | POA: Diagnosis not present

## 2023-05-01 DIAGNOSIS — E1100 Type 2 diabetes mellitus with hyperosmolarity without nonketotic hyperglycemic-hyperosmolar coma (NKHHC): Secondary | ICD-10-CM | POA: Diagnosis not present

## 2023-05-01 DIAGNOSIS — E785 Hyperlipidemia, unspecified: Secondary | ICD-10-CM

## 2023-05-01 DIAGNOSIS — E1140 Type 2 diabetes mellitus with diabetic neuropathy, unspecified: Secondary | ICD-10-CM | POA: Diagnosis not present

## 2023-05-01 DIAGNOSIS — Z8249 Family history of ischemic heart disease and other diseases of the circulatory system: Secondary | ICD-10-CM | POA: Diagnosis not present

## 2023-05-01 DIAGNOSIS — E860 Dehydration: Secondary | ICD-10-CM | POA: Diagnosis present

## 2023-05-01 DIAGNOSIS — E7800 Pure hypercholesterolemia, unspecified: Secondary | ICD-10-CM | POA: Diagnosis not present

## 2023-05-01 DIAGNOSIS — F17210 Nicotine dependence, cigarettes, uncomplicated: Secondary | ICD-10-CM | POA: Diagnosis present

## 2023-05-01 DIAGNOSIS — Z825 Family history of asthma and other chronic lower respiratory diseases: Secondary | ICD-10-CM | POA: Diagnosis not present

## 2023-05-01 DIAGNOSIS — E876 Hypokalemia: Secondary | ICD-10-CM | POA: Diagnosis present

## 2023-05-01 DIAGNOSIS — R81 Glycosuria: Secondary | ICD-10-CM | POA: Diagnosis present

## 2023-05-01 DIAGNOSIS — E872 Acidosis, unspecified: Secondary | ICD-10-CM | POA: Diagnosis not present

## 2023-05-01 DIAGNOSIS — I4892 Unspecified atrial flutter: Secondary | ICD-10-CM | POA: Diagnosis present

## 2023-05-01 DIAGNOSIS — K219 Gastro-esophageal reflux disease without esophagitis: Secondary | ICD-10-CM | POA: Diagnosis present

## 2023-05-01 DIAGNOSIS — R7989 Other specified abnormal findings of blood chemistry: Secondary | ICD-10-CM | POA: Diagnosis not present

## 2023-05-01 DIAGNOSIS — Z6841 Body Mass Index (BMI) 40.0 and over, adult: Secondary | ICD-10-CM | POA: Diagnosis not present

## 2023-05-01 DIAGNOSIS — Z833 Family history of diabetes mellitus: Secondary | ICD-10-CM | POA: Diagnosis not present

## 2023-05-01 DIAGNOSIS — E1165 Type 2 diabetes mellitus with hyperglycemia: Secondary | ICD-10-CM | POA: Diagnosis present

## 2023-05-01 DIAGNOSIS — K029 Dental caries, unspecified: Secondary | ICD-10-CM | POA: Diagnosis present

## 2023-05-01 DIAGNOSIS — Z79899 Other long term (current) drug therapy: Secondary | ICD-10-CM | POA: Diagnosis not present

## 2023-05-01 DIAGNOSIS — Z794 Long term (current) use of insulin: Secondary | ICD-10-CM

## 2023-05-01 DIAGNOSIS — R739 Hyperglycemia, unspecified: Secondary | ICD-10-CM | POA: Diagnosis not present

## 2023-05-01 DIAGNOSIS — Z7901 Long term (current) use of anticoagulants: Secondary | ICD-10-CM | POA: Diagnosis not present

## 2023-05-01 DIAGNOSIS — Z83438 Family history of other disorder of lipoprotein metabolism and other lipidemia: Secondary | ICD-10-CM | POA: Diagnosis not present

## 2023-05-01 DIAGNOSIS — E1169 Type 2 diabetes mellitus with other specified complication: Secondary | ICD-10-CM

## 2023-05-01 DIAGNOSIS — I5022 Chronic systolic (congestive) heart failure: Secondary | ICD-10-CM | POA: Diagnosis present

## 2023-05-01 LAB — RENAL FUNCTION PANEL
Albumin: 3.1 g/dL — ABNORMAL LOW (ref 3.5–5.0)
Anion gap: 8 (ref 5–15)
BUN: 14 mg/dL (ref 8–23)
CO2: 22 mmol/L (ref 22–32)
Calcium: 8.1 mg/dL — ABNORMAL LOW (ref 8.9–10.3)
Chloride: 98 mmol/L (ref 98–111)
Creatinine, Ser: 0.78 mg/dL (ref 0.61–1.24)
GFR, Estimated: >60 mL/min (ref >60)
Glucose, Bld: 278 mg/dL — ABNORMAL HIGH (ref 70–99)
Phosphorus: 2.9 mg/dL (ref 2.5–4.6)
Potassium: 4.4 mmol/L (ref 3.5–5.1)
Sodium: 128 mmol/L — ABNORMAL LOW (ref 135–145)

## 2023-05-01 LAB — PROTIME-INR
INR: 1.1 (ref 0.8–1.2)
Prothrombin Time: 14 seconds (ref 11.4–15.2)

## 2023-05-01 LAB — GLUCOSE, CAPILLARY
Glucose-Capillary: 263 mg/dL — ABNORMAL HIGH (ref 70–99)
Glucose-Capillary: 348 mg/dL — ABNORMAL HIGH (ref 70–99)
Glucose-Capillary: 262 mg/dL — ABNORMAL HIGH (ref 70–99)
Glucose-Capillary: 233 mg/dL — ABNORMAL HIGH (ref 70–99)

## 2023-05-01 MED ORDER — FOLIC ACID 1 MG PO TABS
1.0000 mg | ORAL_TABLET | Freq: Every day | ORAL | Status: DC
  Administered 2023-05-01 – 2023-05-02 (×2): 1 mg via ORAL
  Filled 2023-05-01 (×2): qty 1

## 2023-05-01 MED ORDER — THIAMINE MONONITRATE 100 MG PO TABS
100.0000 mg | ORAL_TABLET | Freq: Every day | ORAL | Status: DC
  Administered 2023-05-01 – 2023-05-02 (×2): 100 mg via ORAL
  Filled 2023-05-01 (×2): qty 1

## 2023-05-01 MED ORDER — AMOXICILLIN 250 MG PO CAPS
500.0000 mg | ORAL_CAPSULE | Freq: Three times a day (TID) | ORAL | Status: DC
  Administered 2023-05-01 – 2023-05-02 (×4): 500 mg via ORAL
  Filled 2023-05-01 (×4): qty 2

## 2023-05-01 MED ORDER — SODIUM CHLORIDE 0.9 % IV SOLN: INTRAVENOUS | Status: DC

## 2023-05-01 MED ORDER — SODIUM CHLORIDE 0.9 % IV SOLN: INTRAVENOUS | Status: AC

## 2023-05-01 MED ORDER — INSULIN GLARGINE-YFGN 100 UNIT/ML ~~LOC~~ SOLN
14.0000 [IU] | Freq: Two times a day (BID) | SUBCUTANEOUS | Status: DC
  Administered 2023-05-01 – 2023-05-02 (×2): 14 [IU] via SUBCUTANEOUS
  Filled 2023-05-01 (×4): qty 0.14

## 2023-05-01 NOTE — Progress Notes (Addendum)
PROGRESS NOTE     David Hammond, is a 62 y.o. male, DOB - 24-Feb-1961, ZOX:[id number]  Admit date - 04/29/2023   Admitting Physician [name], MD  Outpatient Primary MD for the patient is [name]  LOS - 0  Chief Complaint  Patient presents with   Hyperglycemia      Brief Narrative:  62 y.o. male with medical history significant of type 2 diabetes mellitus, hyperlipidemia, obesity admitted on 04/28/2021 for with HHS and  atrial flutter with RVR    -Assessment and Plan: 1)Hyperglycemia secondary to HHS/uncontrolled diabetes with persistent hyperglycemia- -recent A1c was 8.4 -Did Not meet criteria for DKA -Did well with IV insulin and IV fluids -Currently transitioned to subcu insulin -Prior to admission patient was on Trulicity monotherapy but he ran out of Trulicity for almost 3 weeks prior to restarting it about 2 weeks ago 05/01/23 -Change to Semglee 14 units twice daily -Use Novolog/Humalog Sliding scale insulin with Accu-Cheks/Fingersticks as ordered  -Diabetic educator input appreciated   2)Paroxysmal Atrial flutter--with AVR new onset - CHA2DS2- VASc score   is = 2  (DM2/CHF)   Which is  equal to = 2.2 % annual risk of stroke  This patients CHA2DS2-VASc Score and unadjusted Ischemic Stroke Rate (% per year) is equal to 2.2 % stroke rate/year from a score of 2 -05/01/23 --Currently on IV Cardizem at 10 Given low EF will not be prudent to transition to oral Cardizem -Titrating up p.o. metoprolol- --will start to transition off  IV Cardizem  -If able to achieve rate control with oral metoprolol may discharge home with oral metoprolol and Eliquis with outpatient cardiology follow-up including EP eval as outpatient -If tachycardia persist once he comes off IV Cardizem then consider IV amiodarone and/or TEE with cardioversion in a.m on 05/02/23 -Keep him n.p.o. overnight in case he needs TEE with cardioversion -Cardiology consult requested -- Continue subcu  Lovenox for now with plans to transition to DOAC at discharge  3)HFrEF--newly diagnosed systolic dysfunction CHF with EF 35 to 40 %--this may be tachycardia mediated in the setting of atrial flutter with RVR -No mitral stenosis and no aortic stenosis -TSH WNL -CT coronary with calcium score back in May 2021 was unremarkable with a 0 score and no evidence of CAD or significant coronary plaque or stenosis at that time -If EF remains low with repeat echo in 3 to 4 months after heart rate control may consider LHC to rule out ischemic component -Metoprolol as above #2   4)Prolonged QT Interval QTc   515 ms Avoid QT prolonging drugs Magnesium 1.7--received magnesium to keep serum magnesium close to 2 -Hypokalemia noted--- received potassium and keep potassium close to 4   5)Pseudohyponatremia Na was 124 on admission, corrected sodium level based on CBG of 692 equals 133 -Improved with hydration and with better glycemic control  6) right lower molar dental caries--- amoxicillin as prescribed -Patient will have to wait on dental work given the initiation of Eliquis at this time   Lactic acidosis possibly due to dehydration Lactic acid 3.0 > 2.6 Continue IV hydration and continue to trend lactic acid    Diabetic neuropathy Continue pregabalin   Hypercholesterolemia Continue fenofibrate, Lovaza   Morbid Obesity- -Low calorie diet, portion control and increase physical activity discussed with patient -Body mass index is 43.12 kg/m.   Disposition: The patient is from: Home              Anticipated d/c is to: Home  Anticipated d/c date is: 1 day              Patient currently is not medically stable to d/c. Barriers: Not Clinically Stable-   Code Status :  -  Code Status: Full Code   Family Communication:    (patient is alert, awake and coherent)  Discussed with wife at bedside  DVT Prophylaxis  :   - SCDs /Lovenox/  SCDs Start: 04/29/23 2119   Lab Results   Component Value Date   PLT 168 04/30/2023   Inpatient Medications  Scheduled Meds:  Chlorhexidine Gluconate Cloth  6 each Topical Daily   enoxaparin (LOVENOX) injection  140 mg Subcutaneous Q12H   fenofibrate  54 mg Oral Daily   folic acid  1 mg Oral Daily   insulin aspart  0-5 Units Subcutaneous QHS   insulin aspart  0-9 Units Subcutaneous TID WC   insulin aspart  3 Units Subcutaneous TID WC   insulin glargine-yfgn  14 Units Subcutaneous BID   metoprolol tartrate  25 mg Oral Q6H   nicotine  21 mg Transdermal Daily   omega-3 acid ethyl esters  1 g Oral Daily   pregabalin  75 mg Oral BID   thiamine  100 mg Oral Daily   Continuous Infusions:  diltiazem (CARDIZEM) infusion 7.5 mg/hr (05/01/23 1110)   lactated ringers Stopped (04/29/23 2018)   PRN Meds:.acetaminophen **OR** acetaminophen, prochlorperazine   Anti-infectives (From admission, onward)    None       Subjective: [name] today has no fevers, no emesis,  No chest pain,   - -Ambulated without significant dyspnea on exertion dizziness or palpitations--- heart rate with ambulation stayed under 100 this time around -Complains of right lower molar dental pain  Objective: Vitals:   05/01/23 0500 05/01/23 0514 05/01/23 0530 05/01/23 1100  BP: 121/74  108/66   Pulse: (!) 55  78   Resp: 17  16   Temp:  98.3 F (36.8 C)  98 F (36.7 C)  TempSrc:  Oral  Oral  SpO2: 97%  96%   Weight:  (!) 144.2 kg    Height:        Intake/Output Summary (Last 24 hours) at 05/01/2023 1134 Last data filed at 05/01/2023 1108 Gross per 24 hour  Intake 2409.11 ml  Output 2150 ml  Net 259.11 ml   Filed Weights   04/29/23 1638 04/29/23 1647 05/01/23 0514  Weight: (!) 144.2 kg (!) 144.2 kg (!) 144.2 kg    Physical Exam  Gen:- Awake Alert, obese, in no acute distress HEENT:- [place].AT, No sclera icterus, right lower molar appears infected... Overall poor dentition with dental caries Neck-Supple Neck,No JVD,.  Lungs-  CTAB ,  fair symmetrical air movement CV- S1, S2 normal, irregularly irregular and less tachycardic Abd-  +ve B.Sounds, Abd Soft, No tenderness, increased truncal adiposity Extremity/Skin:- No  edema, pedal pulses present  Psych-affect is appropriate, oriented x3 Neuro-no new focal deficits, no tremors  Data Reviewed: I have personally reviewed following labs and imaging studies  CBC: Recent Labs  Lab 04/29/23 1659 04/30/23 0453  WBC 6.7 7.2  NEUTROABS 4.1  --   HGB 18.2* 17.1*  HCT 53.1* 50.0  MCV 92.7 93.6  PLT 180 168   Basic Metabolic Panel: Recent Labs  Lab 04/29/23 2028 04/30/23 0118 04/30/23 0453 04/30/23 0832 05/01/23 0834  NA 132* 132* 132* 130* 128*  K 3.5 3.1* 3.1* 3.5 4.4  CL 98 96* 98 98 98  CO2 24 25  26 24 22   GLUCOSE 240* 198* 157* 165* 278*  BUN 19 17 15 14 14   CREATININE 0.85 0.79 0.74 0.65 0.78  CALCIUM 8.8* 8.1* 8.1* 8.0* 8.1*  MG  --   --  1.7  --   --   PHOS  --   --  3.7  --  2.9   GFR: Estimated Creatinine Clearance: 142.9 mL/min (by C-G formula based on SCr of 0.78 mg/dL).  Recent Results (from the past 240 hour(s))  MRSA Next Gen by PCR, Nasal     Status: None   Collection Time: 04/29/23 10:24 PM   Specimen: Nasal Mucosa; Nasal Swab  Result Value Ref Range Status   MRSA by PCR Next Gen NOT DETECTED NOT DETECTED Final    Comment: (NOTE) The GeneXpert MRSA Assay (FDA approved for NASAL specimens only), is one component of a comprehensive MRSA colonization surveillance program. It is not intended to diagnose MRSA infection nor to guide or monitor treatment for MRSA infections. Test performance is not FDA approved in patients less than 72 years old. Performed at [facility], [address], [place], [place] [address]     Radiology Studies: ECHOCARDIOGRAM COMPLETE  Result Date: 04/30/2023    ECHOCARDIOGRAM REPORT   Patient Name:   ECKER REILLEY Date of Exam: 04/30/2023 Medical Rec #:  [id number]     Height:       72.0 in Accession #:     [id number]    Weight:       318.0 lb Date of Birth:  01-04-1961      BSA:          2.595 m Patient Age:    62 years      BP:           156/92 mmHg Patient Gender: M             HR:           57 bpm. [facility] Procedure: 2D Echo, Color Doppler, Cardiac Doppler and Intracardiac            Opacification Agent Indications:    Abnormal ECG R94.31  History:        Patient has no prior history of Echocardiogram examinations.                 Abnormal ECG, Arrythmias:Atrial Fibrillation; Risk                 Factors:Dyslipidemia, Current Smoker, Diabetes and Sleep Apnea.  Sonographer:    Aron Baba Referring Phys: [id number] [place] Walthall  Sonographer Comments: Patient is obese and Technically difficult study due to poor echo windows. IMPRESSIONS  1. Left ventricular ejection fraction, by estimation, is 35 to 40%. The left ventricle has moderately decreased function. Left ventricular endocardial border not optimally defined to evaluate regional wall motion. not well visualized left ventricular hypertrophy. Left ventricular diastolic parameters are indeterminate.  2. RV not well visualized, grossly appears normal in size and function. . Right ventricular systolic function was not well visualized. The right ventricular size is not well visualized.  3. The mitral valve is normal in structure. Trivial mitral valve regurgitation. No evidence of mitral stenosis.  4. The aortic valve was not well visualized. Aortic valve regurgitation is not visualized. No aortic stenosis is present.  5. Technically difficult study, limited visualization even with echo contrast. FINDINGS  Left Ventricle: Left ventricular ejection fraction, by estimation, is 35 to 40%. The left ventricle has moderately  decreased function. Left ventricular endocardial border not optimally defined to evaluate regional wall motion. Definity contrast agent was given IV to delineate the left ventricular endocardial borders. The left ventricular internal  cavity size was normal in size. Not well visualized left ventricular hypertrophy. Left ventricular diastolic parameters are indeterminate. Right Ventricle: RV not well visualized, grossly appears normal in size and function. The right ventricular size is not well visualized. Right vetricular wall thickness was not well visualized. Right ventricular systolic function was not well visualized. Left Atrium: Left atrial size was normal in size. Right Atrium: Right atrial size was normal in size. Pericardium: The pericardium was not well visualized. Mitral Valve: The mitral valve is normal in structure. Trivial mitral valve regurgitation. No evidence of mitral valve stenosis. Tricuspid Valve: The tricuspid valve is normal in structure. Tricuspid valve regurgitation is not demonstrated. No evidence of tricuspid stenosis. Aortic Valve: The aortic valve was not well visualized. Aortic valve regurgitation is not visualized. No aortic stenosis is present. Aortic valve mean gradient measures 4.6 mmHg. Aortic valve peak gradient measures 9.0 mmHg. Aortic valve area, by VTI measures 1.81 cm. Pulmonic Valve: The pulmonic valve was not well visualized. Pulmonic valve regurgitation is not visualized. No evidence of pulmonic stenosis. Aorta: The aortic root is normal in size and structure. Venous: The inferior vena cava was not well visualized. IAS/Shunts: The interatrial septum was not well visualized.  LEFT VENTRICLE PLAX 2D LVIDd:         4.50 cm   Diastology LVIDs:         3.90 cm   LV e' medial:    10.10 cm/s LV PW:         1.30 cm   LV E/e' medial:  11.0 LV IVS:        1.40 cm   LV e' lateral:   19.00 cm/s LVOT diam:     1.90 cm   LV E/e' lateral: 5.8 LV SV:         39 LV SV Index:   15 LVOT Area:     2.84 cm  RIGHT VENTRICLE RV S prime:     10.60 cm/s TAPSE (M-mode): 2.2 cm LEFT ATRIUM             Index        RIGHT ATRIUM           Index LA diam:        3.60 cm 1.39 cm/m   RA Area:     24.10 cm LA Vol (A2C):   56.5 ml  21.78 ml/m  RA Volume:   70.00 ml  26.98 ml/m LA Vol (A4C):   53.5 ml 20.62 ml/m LA Biplane Vol: 57.7 ml 22.24 ml/m  AORTIC VALVE AV Area (Vmax):    1.74 cm AV Area (Vmean):   1.76 cm AV Area (VTI):     1.81 cm AV Vmax:           150.29 cm/s AV Vmean:          100.377 cm/s AV VTI:            0.215 m AV Peak Grad:      9.0 mmHg AV Mean Grad:      4.6 mmHg LVOT Vmax:         92.03 cm/s LVOT Vmean:        62.300 cm/s LVOT VTI:          0.137 m LVOT/AV VTI ratio: 0.64  AORTA Ao  Root diam: 3.50 cm Ao Asc diam:  3.30 cm MITRAL VALVE MV Area (PHT): 5.88 cm     SHUNTS MV Decel Time: 129 msec     Systemic VTI:  0.14 m MV E velocity: 111.00 cm/s  Systemic Diam: 1.90 cm MV A velocity: 42.00 cm/s MV E/A ratio:  2.64 Dina Rich MD Electronically signed by Dina Rich MD Signature Date/Time: 04/30/2023/4:06:57 PM    Final    DG Chest Portable 1 View  Result Date: 04/29/2023 CLINICAL DATA:  Hyperglycemia and tachycardia EXAM: PORTABLE CHEST 1 VIEW COMPARISON:  Chest radiograph dated 08/25/2020 FINDINGS: Slightly low lung volumes. No focal consolidations. No pleural effusion or pneumothorax. Enlarged cardiomediastinal silhouette is likely projectional. No acute osseous abnormality. IMPRESSION: No active disease. Electronically Signed   By: Agustin Cree M.D.   On: 04/29/2023 18:19    Scheduled Meds:  Chlorhexidine Gluconate Cloth  6 each Topical Daily   enoxaparin (LOVENOX) injection  140 mg Subcutaneous Q12H   fenofibrate  54 mg Oral Daily   folic acid  1 mg Oral Daily   insulin aspart  0-5 Units Subcutaneous QHS   insulin aspart  0-9 Units Subcutaneous TID WC   insulin aspart  3 Units Subcutaneous TID WC   insulin glargine-yfgn  14 Units Subcutaneous BID   metoprolol tartrate  25 mg Oral Q6H   nicotine  21 mg Transdermal Daily   omega-3 acid ethyl esters  1 g Oral Daily   pregabalin  75 mg Oral BID   thiamine  100 mg Oral Daily   Continuous Infusions:  diltiazem (CARDIZEM) infusion 7.5 mg/hr  (05/01/23 1110)   lactated ringers Stopped (04/29/23 2018)    LOS: 0 days   Shon Hale M.D on 05/01/2023 at 11:34 AM  Go to [email or web address] - for contact info  [facility] - Office  [phone]  If 7PM-7AM, please contact night-coverage [email or web address] 05/01/2023, 11:34 AM

## 2023-05-01 NOTE — Inpatient Diabetes Management (Signed)
Inpatient Diabetes Program Recommendations  AACE/ADA: New Consensus Statement on Inpatient Glycemic Control (2015)  Target Ranges:  Prepandial:   less than 140 mg/dL      Peak postprandial:   less than 180 mg/dL (1-2 hours)      Critically ill patients:  140 - 180 mg/dL   Lab Results  Component Value Date   GLUCAP 263 (H) 05/01/2023   HGBA1C 8.4 (H) 04/06/2023    Review of Glycemic Control  Latest Reference Range & Units 04/30/23 09:58 04/30/23 11:18 04/30/23 16:06 04/30/23 20:24 05/01/23 07:48  Glucose-Capillary 70 - 99 mg/dL 161 (H) 096 (H) 045 (H) 315 (H) 263 (H)  (H): Data is abnormally high  Diabetes history: DM2 Outpatient Diabetes medications: Trulicity weekly, Amaryl 2 mg (not taking) Current orders for Inpatient glycemic control: Semglee 12 units BID, Novolog 0-9 units TID and 0-5 units QHS, Novolog 3 units TID  Inpatient Diabetes Program Recommendations:    Please consider:  Semglee 14 units BID Novolog 5 units TID with meals if he consumes at least 50%  Will continue to follow while inpatient.  Thank you, Dulce Sellar, MSN, CDCES Diabetes Coordinator Inpatient Diabetes Program [phone] (team pager from 8a-5p)

## 2023-05-01 NOTE — Telephone Encounter (Signed)
Pharmacy Patient Advocate Encounter  Insurance verification completed.    The patient is insured through [employer]   The patient is currently admitted and ran test claims for the following: Eliquis, Farxiga, [place].  Copays and coinsurance results were relayed to Inpatient clinical team.

## 2023-05-01 NOTE — Progress Notes (Signed)
   [facility] has been requested to perform a transesophageal echocardiogram on David Hammond for atrial flutter.  After careful review of history and examination, the risks and benefits of transesophageal echocardiogram have been explained including risks of esophageal damage, perforation (1:10,000 risk), bleeding, pharyngeal hematoma as well as other potential complications associated with conscious sedation including aspiration, arrhythmia, respiratory failure and death. Alternatives to treatment were discussed, questions were answered. Patient is willing to proceed.   The risks (stroke, cardiac arrhythmias rarely resulting in the need for a temporary or permanent pacemaker, skin irritation or burns and complications associated with conscious sedation including aspiration, arrhythmia, respiratory failure and death), benefits (restoration of normal sinus rhythm) and alternatives of a direct current cardioversion were explained in detail to David Hammond and he agrees to proceed.     David Hammond David Stall, PA-C  05/01/2023 12:44 PM

## 2023-05-01 NOTE — Progress Notes (Signed)
PROGRESS NOTE     David Hammond, is a 62 y.o. male, DOB - Apr 20, 1961, ZYS:[id number]  Admit date - 04/29/2023   Admitting Physician Masiah Lewing Mariea Clonts, MD  Outpatient Primary MD for the patient is [name]  LOS - 0  Chief Complaint  Patient presents with   Hyperglycemia      Brief Narrative:  62 y.o. male with medical history significant of type 2 diabetes mellitus, hyperlipidemia, obesity admitted on 04/28/2021 for with HHS and  atrial flutter with RVR    -Assessment and Plan: 1)Hyperglycemia secondary to HHS/uncontrolled diabetes with persistent hyperglycemia- -recent A1c was 8.4 -Did Not meet criteria for DKA -Did well with IV insulin and IV fluids -Currently transitioned to subcu insulin -Prior to admission patient was on Trulicity monotherapy but he ran out of Trulicity for almost 3 weeks prior to restarting it about 2 weeks ago 05/01/23 -Change to Semglee 14 units twice daily -Use Novolog/Humalog Sliding scale insulin with Accu-Cheks/Fingersticks as ordered  -Diabetic educator input appreciated   2)Paroxysmal Atrial flutter--with AVR new onset - CHA2DS2- VASc score   is = 2  (DM2/CHF)   Which is  equal to = 2.2 % annual risk of stroke  This patients CHA2DS2-VASc Score and unadjusted Ischemic Stroke Rate (% per year) is equal to 2.2 % stroke rate/year from a score of 2 -05/01/23 --Currently on IV Cardizem at 10 Given low EF will not be prudent to transition to oral Cardizem -Titrating up p.o. metoprolol- --will start to transition off  IV Cardizem  -If able to achieve rate control with oral metoprolol may discharge home with oral metoprolol and Eliquis with outpatient cardiology follow-up including EP eval as outpatient -If tachycardia persist once he comes off IV Cardizem then consider IV amiodarone and/or TEE with cardioversion in a.m on 05/02/23 -Keep him n.p.o. overnight in case he needs TEE with cardioversion -Cardiology consult requested -- Continue subcu  Lovenox for now with plans to transition to DOAC at discharge  3)HFrEF--newly diagnosed systolic dysfunction CHF with EF 35 to 40 %--this may be tachycardia mediated in the setting of atrial flutter with RVR -No mitral stenosis and no aortic stenosis -TSH WNL -CT coronary with calcium score back in May 2021 was unremarkable with a 0 score and no evidence of CAD or significant coronary plaque or stenosis at that time -If EF remains low with repeat echo in 3 to 4 months after heart rate control may consider LHC to rule out ischemic component -Metoprolol as above #2   4)Prolonged QT Interval QTc   515 ms Avoid QT prolonging drugs Magnesium 1.7--received magnesium to keep serum magnesium close to 2 -Hypokalemia noted--- received potassium and keep potassium close to 4   5)Pseudohyponatremia Na was 124 on admission, corrected sodium level based on CBG of 692 equals 133 -Improved with hydration and with better glycemic control  6) right lower molar dental caries--- amoxicillin as prescribed -Patient will have to wait on dental work given the initiation of Eliquis at this time   Lactic acidosis possibly due to dehydration Lactic acid 3.0 > 2.6 Continue IV hydration and continue to trend lactic acid    Diabetic neuropathy Continue pregabalin   Hypercholesterolemia Continue fenofibrate, Lovaza   Morbid Obesity- -Low calorie diet, portion control and increase physical activity discussed with patient -Body mass index is 43.12 kg/m.   Disposition: The patient is from: Home              Anticipated d/c is to: Home  Anticipated d/c date is: 1 day              Patient currently is not medically stable to d/c. Barriers: Not Clinically Stable-   Code Status :  -  Code Status: Full Code   Family Communication:    (patient is alert, awake and coherent)  Discussed with wife at bedside  DVT Prophylaxis  :   - SCDs /Lovenox/  SCDs Start: 04/29/23 2119   Lab Results   Component Value Date   PLT 168 04/30/2023   Inpatient Medications  Scheduled Meds:  amoxicillin  500 mg Oral Q8H   Chlorhexidine Gluconate Cloth  6 each Topical Daily   enoxaparin (LOVENOX) injection  140 mg Subcutaneous Q12H   fenofibrate  54 mg Oral Daily   folic acid  1 mg Oral Daily   insulin aspart  0-5 Units Subcutaneous QHS   insulin aspart  0-9 Units Subcutaneous TID WC   insulin aspart  3 Units Subcutaneous TID WC   insulin glargine-yfgn  14 Units Subcutaneous BID   metoprolol tartrate  25 mg Oral Q6H   nicotine  21 mg Transdermal Daily   omega-3 acid ethyl esters  1 g Oral Daily   pregabalin  75 mg Oral BID   thiamine  100 mg Oral Daily   Continuous Infusions:  sodium chloride 20 mL/hr at 05/01/23 1738   sodium chloride     diltiazem (CARDIZEM) infusion Stopped (05/01/23 1709)   PRN Meds:.acetaminophen **OR** acetaminophen, prochlorperazine   Anti-infectives (From admission, onward)    Start     Dose/Rate Route Frequency Ordered Stop   05/01/23 1230  amoxicillin (AMOXIL) capsule 500 mg        500 mg Oral Every 8 hours 05/01/23 1143         Subjective: David Hammond today has no fevers, no emesis,  No chest pain,   - -Ambulated without significant dyspnea on exertion dizziness or palpitations--- heart rate with ambulation stayed under 100 this time around -Complains of right lower molar dental pain  Objective: Vitals:   05/01/23 0514 05/01/23 0530 05/01/23 1100 05/01/23 1630  BP:  108/66    Pulse:  78    Resp:  16    Temp: 98.3 F (36.8 C)  98 F (36.7 C) 98.3 F (36.8 C)  TempSrc: Oral  Oral Oral  SpO2:  96%    Weight: (!) 144.2 kg     Height:        Intake/Output Summary (Last 24 hours) at 05/01/2023 1902 Last data filed at 05/01/2023 1738 Gross per 24 hour  Intake 644.24 ml  Output 600 ml  Net 44.24 ml   Filed Weights   04/29/23 1638 04/29/23 1647 05/01/23 0514  Weight: (!) 144.2 kg (!) 144.2 kg (!) 144.2 kg    Physical Exam  Gen:-  Awake Alert, obese, in no acute distress HEENT:- [place].AT, No sclera icterus, right lower molar appears infected... Overall poor dentition with dental caries Neck-Supple Neck,No JVD,.  Lungs-  CTAB , fair symmetrical air movement CV- S1, S2 normal, irregularly irregular and less tachycardic Abd-  +ve B.Sounds, Abd Soft, No tenderness, increased truncal adiposity Extremity/Skin:- No  edema, pedal pulses present  Psych-affect is appropriate, oriented x3 Neuro-no new focal deficits, no tremors  Data Reviewed: I have personally reviewed following labs and imaging studies  CBC: Recent Labs  Lab 04/29/23 1659 04/30/23 0453  WBC 6.7 7.2  NEUTROABS 4.1  --   HGB 18.2* 17.1*  HCT 53.1*  50.0  MCV 92.7 93.6  PLT 180 168   Basic Metabolic Panel: Recent Labs  Lab 04/29/23 2028 04/30/23 0118 04/30/23 0453 04/30/23 0832 05/01/23 0834  NA 132* 132* 132* 130* 128*  K 3.5 3.1* 3.1* 3.5 4.4  CL 98 96* 98 98 98  CO2 24 25 26 24 22   GLUCOSE 240* 198* 157* 165* 278*  BUN 19 17 15 14 14   CREATININE 0.85 0.79 0.74 0.65 0.78  CALCIUM 8.8* 8.1* 8.1* 8.0* 8.1*  MG  --   --  1.7  --   --   PHOS  --   --  3.7  --  2.9   GFR: Estimated Creatinine Clearance: 142.9 mL/min (by C-G formula based on SCr of 0.78 mg/dL).  Recent Results (from the past 240 hour(s))  MRSA Next Gen by PCR, Nasal     Status: None   Collection Time: 04/29/23 10:24 PM   Specimen: Nasal Mucosa; Nasal Swab  Result Value Ref Range Status   MRSA by PCR Next Gen NOT DETECTED NOT DETECTED Final    Comment: (NOTE) The GeneXpert MRSA Assay (FDA approved for NASAL specimens only), is one component of a comprehensive MRSA colonization surveillance program. It is not intended to diagnose MRSA infection nor to guide or monitor treatment for MRSA infections. Test performance is not FDA approved in patients less than 37 years old. Performed at [facility], [address] [place], [place] [address]     Radiology  Studies: ECHOCARDIOGRAM COMPLETE  Result Date: 04/30/2023    ECHOCARDIOGRAM REPORT   Patient Name:   WITT KAMPHUIS Date of Exam: 04/30/2023 Medical Rec #:  [id number]     Height:       72.0 in Accession #:    [id number]    Weight:       318.0 lb Date of Birth:  05-24-1961      BSA:          2.595 m Patient Age:    62 years      BP:           156/92 mmHg Patient Gender: M             HR:           57 bpm. [facility] Procedure: 2D Echo, Color Doppler, Cardiac Doppler and Intracardiac            Opacification Agent Indications:    Abnormal ECG R94.31  History:        Patient has no prior history of Echocardiogram examinations.                 Abnormal ECG, Arrythmias:Atrial Fibrillation; Risk                 Factors:Dyslipidemia, Current Smoker, Diabetes and Sleep Apnea.  Sonographer:    Aron Baba Referring Phys: [id number] Nilan Iddings  Sonographer Comments: Patient is obese and Technically difficult study due to poor echo windows. IMPRESSIONS  1. Left ventricular ejection fraction, by estimation, is 35 to 40%. The left ventricle has moderately decreased function. Left ventricular endocardial border not optimally defined to evaluate regional wall motion. not well visualized left ventricular hypertrophy. Left ventricular diastolic parameters are indeterminate.  2. RV not well visualized, grossly appears normal in size and function. . Right ventricular systolic function was not well visualized. The right ventricular size is not well visualized.  3. The mitral valve is normal in structure. Trivial mitral valve regurgitation. No evidence  of mitral stenosis.  4. The aortic valve was not well visualized. Aortic valve regurgitation is not visualized. No aortic stenosis is present.  5. Technically difficult study, limited visualization even with echo contrast. FINDINGS  Left Ventricle: Left ventricular ejection fraction, by estimation, is 35 to 40%. The left ventricle has moderately decreased function. Left  ventricular endocardial border not optimally defined to evaluate regional wall motion. Definity contrast agent was given IV to delineate the left ventricular endocardial borders. The left ventricular internal cavity size was normal in size. Not well visualized left ventricular hypertrophy. Left ventricular diastolic parameters are indeterminate. Right Ventricle: RV not well visualized, grossly appears normal in size and function. The right ventricular size is not well visualized. Right vetricular wall thickness was not well visualized. Right ventricular systolic function was not well visualized. Left Atrium: Left atrial size was normal in size. Right Atrium: Right atrial size was normal in size. Pericardium: The pericardium was not well visualized. Mitral Valve: The mitral valve is normal in structure. Trivial mitral valve regurgitation. No evidence of mitral valve stenosis. Tricuspid Valve: The tricuspid valve is normal in structure. Tricuspid valve regurgitation is not demonstrated. No evidence of tricuspid stenosis. Aortic Valve: The aortic valve was not well visualized. Aortic valve regurgitation is not visualized. No aortic stenosis is present. Aortic valve mean gradient measures 4.6 mmHg. Aortic valve peak gradient measures 9.0 mmHg. Aortic valve area, by VTI measures 1.81 cm. Pulmonic Valve: The pulmonic valve was not well visualized. Pulmonic valve regurgitation is not visualized. No evidence of pulmonic stenosis. Aorta: The aortic root is normal in size and structure. Venous: The inferior vena cava was not well visualized. IAS/Shunts: The interatrial septum was not well visualized.  LEFT VENTRICLE PLAX 2D LVIDd:         4.50 cm   Diastology LVIDs:         3.90 cm   LV e' medial:    10.10 cm/s LV PW:         1.30 cm   LV E/e' medial:  11.0 LV IVS:        1.40 cm   LV e' lateral:   19.00 cm/s LVOT diam:     1.90 cm   LV E/e' lateral: 5.8 LV SV:         39 LV SV Index:   15 LVOT Area:     2.84 cm  RIGHT  VENTRICLE RV S prime:     10.60 cm/s TAPSE (M-mode): 2.2 cm LEFT ATRIUM             Index        RIGHT ATRIUM           Index LA diam:        3.60 cm 1.39 cm/m   RA Area:     24.10 cm LA Vol (A2C):   56.5 ml 21.78 ml/m  RA Volume:   70.00 ml  26.98 ml/m LA Vol (A4C):   53.5 ml 20.62 ml/m LA Biplane Vol: 57.7 ml 22.24 ml/m  AORTIC VALVE AV Area (Vmax):    1.74 cm AV Area (Vmean):   1.76 cm AV Area (VTI):     1.81 cm AV Vmax:           150.29 cm/s AV Vmean:          100.377 cm/s AV VTI:            0.215 m AV Peak Grad:      9.0 mmHg  AV Mean Grad:      4.6 mmHg LVOT Vmax:         92.03 cm/s LVOT Vmean:        62.300 cm/s LVOT VTI:          0.137 m LVOT/AV VTI ratio: 0.64  AORTA Ao Root diam: 3.50 cm Ao Asc diam:  3.30 cm MITRAL VALVE MV Area (PHT): 5.88 cm     SHUNTS MV Decel Time: 129 msec     Systemic VTI:  0.14 m MV E velocity: 111.00 cm/s  Systemic Diam: 1.90 cm MV A velocity: 42.00 cm/s MV E/A ratio:  2.64 Dina Rich MD Electronically signed by Dina Rich MD Signature Date/Time: 04/30/2023/4:06:57 PM    Final     Scheduled Meds:  amoxicillin  500 mg Oral Q8H   Chlorhexidine Gluconate Cloth  6 each Topical Daily   enoxaparin (LOVENOX) injection  140 mg Subcutaneous Q12H   fenofibrate  54 mg Oral Daily   folic acid  1 mg Oral Daily   insulin aspart  0-5 Units Subcutaneous QHS   insulin aspart  0-9 Units Subcutaneous TID WC   insulin aspart  3 Units Subcutaneous TID WC   insulin glargine-yfgn  14 Units Subcutaneous BID   metoprolol tartrate  25 mg Oral Q6H   nicotine  21 mg Transdermal Daily   omega-3 acid ethyl esters  1 g Oral Daily   pregabalin  75 mg Oral BID   thiamine  100 mg Oral Daily   Continuous Infusions:  sodium chloride 20 mL/hr at 05/01/23 1738   sodium chloride     diltiazem (CARDIZEM) infusion Stopped (05/01/23 1709)    LOS: 0 days   Shon Hale M.D on 05/01/2023 at 7:02 PM  Go to [email or web address] - for contact info  [facility] - Office   [phone]  If 7PM-7AM, please contact night-coverage [email or web address] 05/01/2023, 7:02 PM

## 2023-05-01 NOTE — Progress Notes (Signed)
Patient evaluated by physician this am after EKG performed, physician requested Cardizem be titrated down to discontinued gradually if tolerated, patient scheduled for TEE tomorrow am will be NPO tomorrow per physician recommendation and approval from cardiologist. Patient titrated from 10mg  to currently 5mg  tolerating well. Doing well overall this morning ate over 75% of breakfast and and lunch meal (provided from home) and ambulated around entire nursing station twice with heart rate remaining at about 76.

## 2023-05-01 NOTE — Consult Note (Signed)
Cardiology Consultation   Patient ID: Abdurraheem Afifi MRN: [id number]; DOB: 12-12-1960  Admit date: 04/29/2023 Date of Consult: 05/01/2023  PCP:  Shawnie Dapper, PA-C      Patient Profile:   Jimarion Otterbein is a 62 y.o. male with a hx of  who is being seen 05/01/2023 for the evaluation of atrial flutter and HFrEF at the request of Dr Mariea Clonts.  History of Present Illness:   Mr. Niziolek is a 61 yo with hs of CP in past, T2DM, ? HTN,OSA (uses CPAP)   CCTA in 2021 showed Ca score 0  Normal coronary arteries  He presented to [facility] ED on 5/19 with dry mouth, dizziness  Off DM meds for a  few months then restarted   Gluces 692.  EKG showed atrial flutter 169 bpm  The pt says over the past several weeks he has been under increased stress mainly from work    He has been working long hours   He did note taht when he went to church recently he broke out into a sweat which was unusual for him    He denies palpitations   No CP    He says his breathing is OK [place] and grows, raises his own food   Very active    has lost 80lbs over the past few years Glucose out of control because he could not get to see PCP and Rx ran out     Past Medical History:  Diagnosis Date   Allergy 2016   Trazodone   Arthritis 1967   Knees   Diabetes mellitus without complication (HCC) 2019   GERD (gastroesophageal reflux disease) 2015   Hypercholesterolemia 04/29/2023   Sleep apnea 2015   Controlled with Tx CPAP    Past Surgical History:  Procedure Laterality Date   HERNIA REPAIR  2012   Umbilical   VASECTOMY         Inpatient Medications: Scheduled Meds:  amoxicillin  500 mg Oral Q8H   Chlorhexidine Gluconate Cloth  6 each Topical Daily   enoxaparin (LOVENOX) injection  140 mg Subcutaneous Q12H   fenofibrate  54 mg Oral Daily   folic acid  1 mg Oral Daily   insulin aspart  0-5 Units Subcutaneous QHS   insulin aspart  0-9 Units Subcutaneous TID WC   insulin aspart  3 Units Subcutaneous TID  WC   insulin glargine-yfgn  14 Units Subcutaneous BID   metoprolol tartrate  25 mg Oral Q6H   nicotine  21 mg Transdermal Daily   omega-3 acid ethyl esters  1 g Oral Daily   pregabalin  75 mg Oral BID   thiamine  100 mg Oral Daily   Continuous Infusions:  diltiazem (CARDIZEM) infusion 5 mg/hr (05/01/23 1254)   lactated ringers Stopped (04/29/23 2018)   PRN Meds: acetaminophen **OR** acetaminophen, prochlorperazine  Allergies:    Allergies  Allergen Reactions   Trazodone Palpitations and Shortness Of Breath    Social History:   Social History   Socioeconomic History   Marital status: Married    Spouse name: Not on file   Number of children: Not on file   Years of education: Not on file   Highest education level: Not on file  Occupational History   Not on file  Tobacco Use   Smoking status: Light Smoker    Packs/day: 0.00    Years: 40.00    Additional pack years: 0.00    Total pack years: 0.00  Types: Pipe, Cigarettes   Smokeless tobacco: Never   Tobacco comments:    1-2 pipes per day. Used to smoke cigarettes. Quit about 2015. Use nicotine replacement gum/mints.  Vaping Use   Vaping Use: Never used  Substance and Sexual Activity   Alcohol use: Yes   Drug use: Never   Sexual activity: Yes    Partners: Female    Birth control/protection: None    Comment: Vasectomy  Other Topics Concern   Not on file  Social History Narrative   Not on file   Social Determinants of Health   Financial Resource Strain: Low Risk  (06/03/2019)   Overall Financial Resource Strain (CARDIA)    Difficulty of Paying Living Expenses: Not hard at all  Food Insecurity: No Food Insecurity (04/30/2023)   Hunger Vital Sign    Worried About Running Out of Food in the Last Year: Never true    Ran Out of Food in the Last Year: Never true  Transportation Needs: No Transportation Needs (04/30/2023)   PRAPARE - Administrator, Civil Service (Medical): No    Lack of Transportation  (Non-Medical): No  Physical Activity: Unknown (06/03/2019)   Exercise Vital Sign    Days of Exercise per Week: 7 days    Minutes of Exercise per Session: Not on file  Stress: No Stress Concern Present (06/03/2019)   [employer] of Occupational Health - Occupational Stress Questionnaire    Feeling of Stress : Only a little  Social Connections: Unknown (06/03/2019)   Social Connection and Isolation Panel [NHANES]    Frequency of Communication with Friends and Family: Three times a week    Frequency of Social Gatherings with Friends and Family: Twice a week    Attends Religious Services: Not on Marketing executive or Organizations: Not on file    Attends Banker Meetings: Not on file    Marital Status: Married  Intimate Partner Violence: Not At Risk (04/30/2023)   Humiliation, Afraid, Rape, and Kick questionnaire    Fear of Current or Ex-Partner: No    Emotionally Abused: No    Physically Abused: No    Sexually Abused: No    Family History:    Family History  Problem Relation Age of Onset   Arthritis Father    Cancer Father    COPD Father    Diabetes Father    Heart disease Father    Hyperlipidemia Father    Hypertension Father    Kidney disease Father    Stroke Father    Arthritis Mother    Varicose Veins Mother      ROS:  Please see the history of present illness.   All other ROS reviewed and negative.     Physical Exam/Data:   Vitals:   05/01/23 0500 05/01/23 0514 05/01/23 0530 05/01/23 1100  BP: 121/74  108/66   Pulse: (!) 55  78   Resp: 17  16   Temp:  98.3 F (36.8 C)  98 F (36.7 C)  TempSrc:  Oral  Oral  SpO2: 97%  96%   Weight:  (!) 144.2 kg    Height:        Intake/Output Summary (Last 24 hours) at 05/01/2023 1310 Last data filed at 05/01/2023 1108 Gross per 24 hour  Intake 1378.69 ml  Output 2150 ml  Net -771.31 ml      05/01/2023    5:14 AM 04/29/2023    4:47 PM 04/29/2023  4:38 PM  Last 3 Weights  Weight (lbs)  317 lb 14.5 oz 318 lb 318 lb  Weight (kg) 144.2 kg 144.244 kg 144.244 kg     Body mass index is 43.12 kg/m.  General:  Obese 62yo in no acute distress HEENT: normal Neck: no JVD Vascular: No carotid bruits; Distal pulses 2+ bilaterally Cardiac: Irreg irreg  No S3  no murmur  Lungs:  clear to auscultation bilaterally, no wheezing, rhonchi or rales  Abd: soft, nontender, no hepatomegaly Obese  Ext: no edema  Varicosities noted    Some ruddiness to skin Musculoskeletal:  No deformities, BUE and BLE strength normal and equal Skin: warm and dry  Neuro:  CNs 2-12 intact, no focal abnormalities noted Psych:  Normal affect   EKG:  The EKG was personally reviewed and demonstrates:  Atrial flutter 169 bpm Telemetry:  Telemetry was personally reviewed and demonstrates:  Atrial flutter with variable conduction   Rates 80s   Relevant CV Studies:  Echo 04/30/23  1. Left ventricular ejection fraction, by estimation, is 35 to 40%. The  left ventricle has moderately decreased function. Left ventricular  endocardial border not optimally defined to evaluate regional wall motion.  not well visualized left ventricular  hypertrophy. Left ventricular diastolic parameters are indeterminate.   2. RV not well visualized, grossly appears normal in size and function. .  Right ventricular systolic function was not well visualized. The right  ventricular size is not well visualized.   3. The mitral valve is normal in structure. Trivial mitral valve  regurgitation. No evidence of mitral stenosis.   4. The aortic valve was not well visualized. Aortic valve regurgitation  is not visualized. No aortic stenosis is present.   5. Technically difficult study, limited visualization even with echo  contrast.  CCTA   May 2021 FINDINGS: Coronary calcium score: The patient's coronary artery calcium score is 0, which places the patient in the 0 percentile.   Coronary arteries: Normal coronary origins.  Left  dominance.   Right Coronary Artery: Small, nondominant vessel. No significant plaque or stenosis.   Left Main Coronary Artery: Normal caliber vessel. No significant plaque or stenosis.   Left Anterior Descending Coronary Artery: Normal caliber vessel. No significant plaque or stenosis. Gives rise to 1 large D1/ramus and 1 small D2 diagonal branches. Distal LAD wraps apex.   Left Circumflex Artery: Normal caliber vessel, gives rise to PDA. No significant plaque or stenosis. Gives rise to 1 large and 1 small OM branches.   Aorta: Normal size, 31 mm at the mid ascending aorta (level of the PA bifurcation) measured double oblique. No calcifications. No dissection.   Aortic Valve: No calcifications. Trileaflet.   Other findings:   Normal pulmonary vein drainage into the left atrium.   Normal left atrial appendage without a thrombus.   Normal size of the pulmonary artery.   RA/RV appear mildly enlarged.   IMPRESSION: 1. No evidence of CAD, CADRADS = 0.   2. Coronary calcium score of 0. This was 0 percentile for age and sex matched control.   3. Normal coronary origin with left dominance.   4. RA/RV appear mildly enlarged, with normal PA size. Recommend clinical correlation    Laboratory Data:  High Sensitivity Troponin:   Recent Labs  Lab 04/29/23 1729  TROPONINIHS 42*     Chemistry Recent Labs  Lab 04/30/23 0453 04/30/23 0832 05/01/23 0834  NA 132* 130* 128*  K 3.1* 3.5 4.4  CL 98 98 98  CO2 26 24 22   GLUCOSE 157* 165* 278*  BUN 15 14 14   CREATININE 0.74 0.65 0.78  CALCIUM 8.1* 8.0* 8.1*  MG 1.7  --   --   GFRNONAA >60 >60 >60  ANIONGAP 8 8 8     Recent Labs  Lab 05/01/23 0834  ALBUMIN 3.1*   Lipids No results for input(s): "CHOL", "TRIG", "HDL", "LABVLDL", "LDLCALC", "CHOLHDL" in the last 168 hours.  Hematology Recent Labs  Lab 04/29/23 1659 04/30/23 0453  WBC 6.7 7.2  RBC 5.73 5.34  HGB 18.2* 17.1*  HCT 53.1* 50.0  MCV 92.7 93.6  MCH  31.8 32.0  MCHC 34.3 34.2  RDW 12.4 12.3  PLT 180 168   Thyroid  Recent Labs  Lab 04/30/23 0832  TSH 1.309    BNPNo results for input(s): "BNP", "PROBNP" in the last 168 hours.  DDimer No results for input(s): "DDIMER" in the last 168 hours.   Radiology/Studies:  ECHOCARDIOGRAM COMPLETE  Result Date: 04/30/2023    ECHOCARDIOGRAM REPORT   Patient Name:   DAZION WYZYKOWSKI Date of Exam: 04/30/2023 Medical Rec #:  [id number]     Height:       72.0 in Accession #:    [id number]    Weight:       318.0 lb Date of Birth:  02/16/1961      BSA:          2.595 m Patient Age:    62 years      BP:           156/92 mmHg Patient Gender: M             HR:           57 bpm. [facility] Procedure: 2D Echo, Color Doppler, Cardiac Doppler and Intracardiac            Opacification Agent Indications:    Abnormal ECG R94.31  History:        Patient has no prior history of Echocardiogram examinations.                 Abnormal ECG, Arrythmias:Atrial Fibrillation; Risk                 Factors:Dyslipidemia, Current Smoker, Diabetes and Sleep Apnea.  Sonographer:    Aron Baba Referring Phys: [id number] COURAGE EMOKPAE  Sonographer Comments: Patient is obese and Technically difficult study due to poor echo windows. IMPRESSIONS  1. Left ventricular ejection fraction, by estimation, is 35 to 40%. The left ventricle has moderately decreased function. Left ventricular endocardial border not optimally defined to evaluate regional wall motion. not well visualized left ventricular hypertrophy. Left ventricular diastolic parameters are indeterminate.  2. RV not well visualized, grossly appears normal in size and function. . Right ventricular systolic function was not well visualized. The right ventricular size is not well visualized.  3. The mitral valve is normal in structure. Trivial mitral valve regurgitation. No evidence of mitral stenosis.  4. The aortic valve was not well visualized. Aortic valve regurgitation is not  visualized. No aortic stenosis is present.  5. Technically difficult study, limited visualization even with echo contrast. FINDINGS  Left Ventricle: Left ventricular ejection fraction, by estimation, is 35 to 40%. The left ventricle has moderately decreased function. Left ventricular endocardial border not optimally defined to evaluate regional wall motion. Definity contrast agent was given IV to delineate the left ventricular endocardial borders. The left ventricular internal cavity size was normal in size. Not  well visualized left ventricular hypertrophy. Left ventricular diastolic parameters are indeterminate. Right Ventricle: RV not well visualized, grossly appears normal in size and function. The right ventricular size is not well visualized. Right vetricular wall thickness was not well visualized. Right ventricular systolic function was not well visualized. Left Atrium: Left atrial size was normal in size. Right Atrium: Right atrial size was normal in size. Pericardium: The pericardium was not well visualized. Mitral Valve: The mitral valve is normal in structure. Trivial mitral valve regurgitation. No evidence of mitral valve stenosis. Tricuspid Valve: The tricuspid valve is normal in structure. Tricuspid valve regurgitation is not demonstrated. No evidence of tricuspid stenosis. Aortic Valve: The aortic valve was not well visualized. Aortic valve regurgitation is not visualized. No aortic stenosis is present. Aortic valve mean gradient measures 4.6 mmHg. Aortic valve peak gradient measures 9.0 mmHg. Aortic valve area, by VTI measures 1.81 cm. Pulmonic Valve: The pulmonic valve was not well visualized. Pulmonic valve regurgitation is not visualized. No evidence of pulmonic stenosis. Aorta: The aortic root is normal in size and structure. Venous: The inferior vena cava was not well visualized. IAS/Shunts: The interatrial septum was not well visualized.  LEFT VENTRICLE PLAX 2D LVIDd:         4.50 cm    Diastology LVIDs:         3.90 cm   LV e' medial:    10.10 cm/s LV PW:         1.30 cm   LV E/e' medial:  11.0 LV IVS:        1.40 cm   LV e' lateral:   19.00 cm/s LVOT diam:     1.90 cm   LV E/e' lateral: 5.8 LV SV:         39 LV SV Index:   15 LVOT Area:     2.84 cm  RIGHT VENTRICLE RV S prime:     10.60 cm/s TAPSE (M-mode): 2.2 cm LEFT ATRIUM             Index        RIGHT ATRIUM           Index LA diam:        3.60 cm 1.39 cm/m   RA Area:     24.10 cm LA Vol (A2C):   56.5 ml 21.78 ml/m  RA Volume:   70.00 ml  26.98 ml/m LA Vol (A4C):   53.5 ml 20.62 ml/m LA Biplane Vol: 57.7 ml 22.24 ml/m  AORTIC VALVE AV Area (Vmax):    1.74 cm AV Area (Vmean):   1.76 cm AV Area (VTI):     1.81 cm AV Vmax:           150.29 cm/s AV Vmean:          100.377 cm/s AV VTI:            0.215 m AV Peak Grad:      9.0 mmHg AV Mean Grad:      4.6 mmHg LVOT Vmax:         92.03 cm/s LVOT Vmean:        62.300 cm/s LVOT VTI:          0.137 m LVOT/AV VTI ratio: 0.64  AORTA Ao Root diam: 3.50 cm Ao Asc diam:  3.30 cm MITRAL VALVE MV Area (PHT): 5.88 cm     SHUNTS MV Decel Time: 129 msec     Systemic VTI:  0.14 m MV E velocity:  111.00 cm/s  Systemic Diam: 1.90 cm MV A velocity: 42.00 cm/s MV E/A ratio:  2.64 Dina Rich MD Electronically signed by Dina Rich MD Signature Date/Time: 04/30/2023/4:06:57 PM    Final    DG Chest Portable 1 View  Result Date: 04/29/2023 CLINICAL DATA:  Hyperglycemia and tachycardia EXAM: PORTABLE CHEST 1 VIEW COMPARISON:  Chest radiograph dated 08/25/2020 FINDINGS: Slightly low lung volumes. No focal consolidations. No pleural effusion or pneumothorax. Enlarged cardiomediastinal silhouette is likely projectional. No acute osseous abnormality. IMPRESSION: No active disease. Electronically Signed   By: Agustin Cree M.D.   On: 04/29/2023 18:19     Assessment and Plan:   Atrial flutter.  The patient did not sense heart racing  Not sure how long tachycardic  Currently rates are much better on  medication. With DM and HTN his CHADSVASc score is 2. I have reviewed with pt   He would like to proceed with TEE / Cardioversion to potentially get back into SR sooner. TEE will give another look at LV function  2  HFrEF   I have  reviewed echo   Images are very difficult   LV does appear down some     May be tachy induced  WIll reassess tomorrow with TEE Currently volume looks OK Further recomm based on these findings   3  Dyslipidemia    Trig extremely elevated due to DM   Rx and recheck later this year   Ca score was 0 several years ago    His diet  is different     4 OSA  Continue CPAP  5  DM  Per IM      For questions or updates, please contact [facility] Please consult [email or web address] for contact info under    Signed, Dietrich Pates, MD  05/01/2023 1:10 PM

## 2023-05-01 NOTE — TOC Benefit Eligibility Note (Signed)
Patient Product/process development scientist completed.    The patient is currently admitted and upon discharge could be taking Eliquis 5 mg.  The current 30 day co-pay is $108.21.   The patient is currently admitted and upon discharge could be taking Farxiga 10 mg  Prior Authorization Required  The patient is currently admitted and upon discharge could be taking Jardiance 10 mg  Prior Authorization Required  The patient is insured through [employer]   This test claim was processed through [employer]- copay amounts may vary at other pharmacies due to [employer], or as the patient moves through the different stages of their insurance plan.  Roland Earl, CPHT Pharmacy Patient Advocate Specialist [facility] Patient Advocate Team Direct Number: [phone]  Fax: [phone]

## 2023-05-02 ENCOUNTER — Inpatient Hospital Stay (HOSPITAL_COMMUNITY): Payer: Federal, State, Local not specified - PPO

## 2023-05-02 ENCOUNTER — Inpatient Hospital Stay (HOSPITAL_COMMUNITY): Payer: Federal, State, Local not specified - PPO | Admitting: Anesthesiology

## 2023-05-02 ENCOUNTER — Encounter (HOSPITAL_COMMUNITY): Payer: Self-pay | Admitting: Family Medicine

## 2023-05-02 ENCOUNTER — Encounter (HOSPITAL_COMMUNITY)
Admission: EM | Disposition: A | Discharge status: Home / Self Care | Payer: Self-pay | Source: Home / Self Care | Attending: Family Medicine

## 2023-05-02 ENCOUNTER — Other Ambulatory Visit: Payer: Self-pay

## 2023-05-02 DIAGNOSIS — E1100 Type 2 diabetes mellitus with hyperosmolarity without nonketotic hyperglycemic-hyperosmolar coma (NKHHC): Secondary | ICD-10-CM

## 2023-05-02 DIAGNOSIS — I4892 Unspecified atrial flutter: Secondary | ICD-10-CM | POA: Diagnosis not present

## 2023-05-02 DIAGNOSIS — E7800 Pure hypercholesterolemia, unspecified: Secondary | ICD-10-CM | POA: Diagnosis not present

## 2023-05-02 DIAGNOSIS — R7989 Other specified abnormal findings of blood chemistry: Secondary | ICD-10-CM

## 2023-05-02 HISTORY — PX: CARDIOVERSION: SHX1299

## 2023-05-02 HISTORY — PX: TEE WITHOUT CARDIOVERSION: SHX5443

## 2023-05-02 LAB — GLUCOSE, CAPILLARY
Glucose-Capillary: 200 mg/dL — ABNORMAL HIGH (ref 70–99)
Glucose-Capillary: 147 mg/dL — ABNORMAL HIGH (ref 70–99)
Glucose-Capillary: 149 mg/dL — ABNORMAL HIGH (ref 70–99)
Glucose-Capillary: 119 mg/dL — ABNORMAL HIGH (ref 70–99)
Glucose-Capillary: 197 mg/dL — ABNORMAL HIGH (ref 70–99)

## 2023-05-02 LAB — BASIC METABOLIC PANEL
Anion gap: 7 (ref 5–15)
BUN: 16 mg/dL (ref 8–23)
CO2: 25 mmol/L (ref 22–32)
Calcium: 8.1 mg/dL — ABNORMAL LOW (ref 8.9–10.3)
Chloride: 100 mmol/L (ref 98–111)
Creatinine, Ser: 0.69 mg/dL (ref 0.61–1.24)
GFR, Estimated: >60 mL/min (ref >60)
Glucose, Bld: 157 mg/dL — ABNORMAL HIGH (ref 70–99)
Potassium: 3.8 mmol/L (ref 3.5–5.1)
Sodium: 132 mmol/L — ABNORMAL LOW (ref 135–145)

## 2023-05-02 SURGERY — CARDIOVERSION
Anesthesia: General

## 2023-05-02 MED ORDER — METOCLOPRAMIDE HCL 5 MG/ML IJ SOLN
INTRAMUSCULAR | Status: AC
  Administered 2023-05-02: 10 mg via INTRAVENOUS
  Filled 2023-05-02: qty 2

## 2023-05-02 MED ORDER — INSULIN GLARGINE-YFGN 100 UNIT/ML ~~LOC~~ SOLN
14.0000 [IU] | Freq: Two times a day (BID) | SUBCUTANEOUS | Status: DC
  Administered 2023-05-02: 14 [IU] via SUBCUTANEOUS
  Filled 2023-05-02 (×2): qty 0.14

## 2023-05-02 MED ORDER — VITAMIN B-1 100 MG PO TABS: 100.0000 mg | ORAL_TABLET | Freq: Every day | ORAL | 1 refills | Status: AC

## 2023-05-02 MED ORDER — PROPOFOL 10 MG/ML IV BOLUS
INTRAVENOUS | Status: DC | PRN
  Administered 2023-05-02: 100 mg via INTRAVENOUS

## 2023-05-02 MED ORDER — SUCCINYLCHOLINE CHLORIDE 200 MG/10ML IV SOSY
PREFILLED_SYRINGE | INTRAVENOUS | Status: DC | PRN
  Administered 2023-05-02: 200 mg via INTRAVENOUS

## 2023-05-02 MED ORDER — PHENYLEPHRINE HCL (PRESSORS) 10 MG/ML IV SOLN
INTRAVENOUS | Status: DC | PRN
  Administered 2023-05-02 (×2): 160 ug via INTRAVENOUS

## 2023-05-02 MED ORDER — APIXABAN 5 MG PO TABS: 5.0000 mg | ORAL_TABLET | Freq: Two times a day (BID) | ORAL | 2 refills | Status: AC

## 2023-05-02 MED ORDER — ETOMIDATE 2 MG/ML IV SOLN
INTRAVENOUS | Status: DC | PRN
  Administered 2023-05-02: 10 mg via INTRAVENOUS

## 2023-05-02 MED ORDER — AMOXICILLIN 500 MG PO CAPS: 500.0000 mg | ORAL_CAPSULE | Freq: Three times a day (TID) | ORAL | 0 refills | Status: AC

## 2023-05-02 MED ORDER — LACTATED RINGERS IV SOLN: INTRAVENOUS | Status: DC

## 2023-05-02 MED ORDER — APIXABAN 5 MG PO TABS: 5.0000 mg | ORAL_TABLET | Freq: Two times a day (BID) | ORAL | Status: DC

## 2023-05-02 MED ORDER — PROPOFOL 10 MG/ML IV BOLUS
INTRAVENOUS | Status: AC
  Filled 2023-05-02: qty 20

## 2023-05-02 MED ORDER — METOCLOPRAMIDE HCL 5 MG/ML IJ SOLN: 10.0000 mg | Freq: Once | INTRAMUSCULAR | Status: AC

## 2023-05-02 MED ORDER — CHLORHEXIDINE GLUCONATE 0.12 % MT SOLN
OROMUCOSAL | Status: AC
  Administered 2023-05-02: 15 mL via OROMUCOSAL
  Filled 2023-05-02: qty 15

## 2023-05-02 MED ORDER — ORAL CARE MOUTH RINSE: 15.0000 mL | Freq: Once | OROMUCOSAL | Status: AC

## 2023-05-02 MED ORDER — PEN NEEDLES 31G X 8 MM MISC: 1.0000 [IU] | 2 refills | Status: AC

## 2023-05-02 MED ORDER — METOPROLOL TARTRATE 25 MG PO TABS: 25.0000 mg | ORAL_TABLET | Freq: Two times a day (BID) | ORAL | 2 refills | Status: DC

## 2023-05-02 MED ORDER — INSULIN GLARGINE 100 UNIT/ML SOLOSTAR PEN: 15.0000 [IU] | PEN_INJECTOR | Freq: Every day | SUBCUTANEOUS | 1 refills | Status: DC

## 2023-05-02 MED ORDER — CHLORHEXIDINE GLUCONATE 0.12 % MT SOLN: 15.0000 mL | Freq: Once | OROMUCOSAL | Status: AC

## 2023-05-02 MED ORDER — ETOMIDATE 2 MG/ML IV SOLN
INTRAVENOUS | Status: AC
  Filled 2023-05-02: qty 10

## 2023-05-02 MED ORDER — FOLIC ACID 1 MG PO TABS: 1.0000 mg | ORAL_TABLET | Freq: Every day | ORAL | 1 refills | Status: AC

## 2023-05-02 NOTE — Anesthesia Preprocedure Evaluation (Addendum)
Anesthesia Evaluation  Patient identified by MRN, date of birth, ID band Patient awake    Reviewed: Allergy & Precautions, H&P , NPO status , Patient's Chart, lab work & pertinent test results  Airway Mallampati: III  TM Distance: >3 FB Neck ROM: Full    Dental  (+) Dental Advisory Given, Poor Dentition, Chipped, Missing, Caps   Pulmonary sleep apnea and Continuous Positive Airway Pressure Ventilation , Current Smoker and Patient abstained from smoking.   Pulmonary exam normal breath sounds clear to auscultation       Cardiovascular Exercise Tolerance: Good +CHF  + dysrhythmias Atrial Fibrillation  Rhythm:Regular Rate:Normal     Neuro/Psych negative neurological ROS  negative psych ROS   GI/Hepatic Neg liver ROS,GERD  Medicated and Controlled,,  Endo/Other  diabetes, Well Controlled, Type 2, Oral Hypoglycemic Agents  Morbid obesity  Renal/GU negative Renal ROS  negative genitourinary   Musculoskeletal  (+) Arthritis , Osteoarthritis,    Abdominal   Peds negative pediatric ROS (+)  Hematology negative hematology ROS (+)   Anesthesia Other Findings   Reproductive/Obstetrics negative OB ROS                             Anesthesia Physical Anesthesia Plan  ASA: 3 and emergent  Anesthesia Plan: General   Post-op Pain Management: Minimal or no pain anticipated   Induction: Intravenous, Rapid sequence and Cricoid pressure planned  PONV Risk Score and Plan: Metaclopromide and Ondansetron  Airway Management Planned: Oral ETT and Video Laryngoscope Planned  Additional Equipment:   Intra-op Plan:   Post-operative Plan: Extubation in OR  Informed Consent: I have reviewed the patients History and Physical, chart, labs and discussed the procedure including the risks, benefits and alternatives for the proposed anesthesia with the patient or authorized representative who has indicated his/her  understanding and acceptance.     Dental advisory given  Plan Discussed with: CRNA and Surgeon  Anesthesia Plan Comments: (Patient took Trulicity on Sunday morning, possible full stomach and risk of aspiration, explained to the patient, agreed to get GA via ETT to protect airway from aspiration)        Anesthesia Quick Evaluation

## 2023-05-02 NOTE — Procedures (Addendum)
Procedures   1  Transesophageal  Echo  Indication:  Atrial flutter   Shared Decision Making/Informed Consent The risks [stroke, cardiac arrhythmias rarely resulting in the need for a temporary or permanent pacemaker, skin irritation or burns, esophageal damage, perforation (1:10,000 risk), bleeding, pharyngeal hematoma as well as other potential complications associated with conscious sedation including aspiration, arrhythmia, respiratory failure and death], benefits (treatment guidance, restoration of normal sinus rhythm, diagnostic support) and alternatives of a transesophageal echocardiogram guided cardioversion were discussed in detail with David Hammond and he is willing to proceed.    PT sedated/intubated by anesthesia Mouth guard placed in mouth  TEE probe advanced to mid esophagus without difficulty  Preliminary:  LA, LAA without masses RA without masses No PFO as tested with injection of agitated saline  TV normal  Mild TR AV normal  No AI MV normal   Mild MR PV normal  No PI  LVEF appears moderately depressed Minimal plaquing of thoracic aorta     Full report to follow in CV section of chart  Dietrich Pates MD   2  CARDIOVERSION  PT sedated for TEE   With pads in AP position patient cardioverted to SR with 200 Joules synchronized biphasic energy.   Procedure without complication.  Dietrich Pates MD

## 2023-05-02 NOTE — Discharge Instructions (Addendum)
IMPORTANT INFORMATION: PAY CLOSE ATTENTION   PHYSICIAN DISCHARGE INSTRUCTIONS  Follow with Primary care provider  David Dapper, PA-C  and other consultants as instructed by your Hospitalist Physician  SEEK MEDICAL CARE OR RETURN TO EMERGENCY ROOM IF SYMPTOMS COME BACK, WORSEN OR NEW PROBLEM DEVELOPS +   Please note: You were cared for by a hospitalist during your hospital stay. Every effort will be made to forward records to your primary care provider.  You can request that your primary care provider send for your hospital records if they have not received them.  Once you are discharged, your primary care physician will handle any further medical issues. Please note that NO REFILLS for any discharge medications will be authorized once you are discharged, as it is imperative that you return to your primary care physician (or establish a relationship with a primary care physician if you do not have one) for your post hospital discharge needs so that they can reassess your need for medications and monitor your lab values.  Please get a complete blood count and chemistry panel checked by your Primary MD at your next visit, and again as instructed by your Primary MD.  Get Medicines reviewed and adjusted: Please take all your medications with you for your next visit with your Primary MD  Laboratory/radiological data: Please request your Primary MD to go over all hospital tests and procedure/radiological results at the follow up, please ask your primary care provider to get all Hospital records sent to his/her office.  In some cases, they will be blood work, cultures and biopsy results pending at the time of your discharge. Please request that your primary care provider follow up on these results.  If you are diabetic, please bring your blood sugar readings with you to your follow up appointment with primary care.    Please call and make your follow up appointments as soon as possible.    Also  Note the following: If you experience worsening of your admission symptoms, develop shortness of breath, life threatening emergency, suicidal or homicidal thoughts you must seek medical attention immediately by calling 911 or calling your MD immediately  if symptoms less severe.  You must read complete instructions/literature along with all the possible adverse reactions/side effects for all the Medicines you take and that have been prescribed to you. Take any new Medicines after you have completely understood and accpet all the possible adverse reactions/side effects.   Do not drive when taking Pain medications or sleeping medications (Benzodiazepines)  Do not take more than prescribed Pain, Sleep and Anxiety Medications. It is not advisable to combine anxiety,sleep and pain medications without talking with your primary care practitioner  Special Instructions: If you have smoked or chewed Tobacco  in the last 2 yrs please stop smoking, stop any regular Alcohol  and or any Recreational drug use.  Wear Seat belts while driving.  Do not drive if taking any narcotic, mind altering or controlled substances or recreational drugs or alcohol.     Information on my medicine - ELIQUIS (apixaban)   Why was Eliquis prescribed for you? Eliquis was prescribed for you to reduce the risk of a blood clot forming that can cause a stroke if you have a medical condition called atrial fibrillation (a type of irregular heartbeat).  What do You need to know about Eliquis ? Take your Eliquis TWICE DAILY - one tablet in the morning and one tablet in the evening with or without food. If you have  difficulty swallowing the tablet whole please discuss with your pharmacist how to take the medication safely.  Take Eliquis exactly as prescribed by your doctor and DO NOT stop taking Eliquis without talking to the doctor who prescribed the medication.  Stopping may increase your risk of developing a stroke.  Refill  your prescription before you run out.  After discharge, you should have regular check-up appointments with your healthcare provider that is prescribing your Eliquis.  In the future your dose may need to be changed if your kidney function or weight changes by a significant amount or as you get older.  What do you do if you miss a dose? If you miss a dose, take it as soon as you remember on the same day and resume taking twice daily.  Do not take more than one dose of ELIQUIS at the same time to make up a missed dose.  Important Safety Information A possible side effect of Eliquis is bleeding. You should call your healthcare provider right away if you experience any of the following: Bleeding from an injury or your nose that does not stop. Unusual colored urine (red or dark brown) or unusual colored stools (red or black). Unusual bruising for unknown reasons. A serious fall or if you hit your head (even if there is no bleeding).  Some medicines may interact with Eliquis and might increase your risk of bleeding or clotting while on Eliquis. To help avoid this, consult your healthcare provider or pharmacist prior to using any new prescription or non-prescription medications, including herbals, vitamins, non-steroidal anti-inflammatory drugs (NSAIDs) and supplements.  This website has more information on Eliquis (apixaban): http://www.eliquis.com/eliquis/home

## 2023-05-02 NOTE — Progress Notes (Signed)
*  PRELIMINARY RESULTS* Echocardiogram Echocardiogram Transesophageal has been performed.  Stacey Drain 05/02/2023, 2:59 PM

## 2023-05-02 NOTE — Transfer of Care (Signed)
Immediate Anesthesia Transfer of Care Note  Patient: David Hammond  Procedure(s) Performed: CARDIOVERSION TRANSESOPHAGEAL ECHOCARDIOGRAM (TEE)  Patient Location: PACU  Anesthesia Type:General  Level of Consciousness: awake, alert , and oriented  Airway & Oxygen Therapy: Patient Spontanous Breathing and Patient connected to face mask oxygen  Post-op Assessment: Report given to RN, Post -op Vital signs reviewed and stable, Patient moving all extremities X 4, and Patient able to stick tongue midline  Post vital signs: Reviewed  Last Vitals:  Vitals Value Taken Time  BP 111/82   Temp 98.6   Pulse 97 05/02/23 1410  Resp 16 05/02/23 1410  SpO2 100 % 05/02/23 1410  Vitals shown include unvalidated device data.  Last Pain:  Vitals:   05/02/23 1211  TempSrc: Oral  PainSc: 0-No pain         Complications: No notable events documented.

## 2023-05-02 NOTE — Discharge Summary (Signed)
Physician Discharge Summary  David Hammond UJW:[id number] DOB: 1961-08-22 DOA: 04/29/2023  PCP: Shawnie Dapper, PA-C Cardiology: [facility] Admit date: 04/29/2023 Discharge date: 05/02/2023  Admitted From:  Home  Disposition: Home   Patient insists on discharging home today  Recommendations for Outpatient Follow-up:  Follow up with PCP in 1 weeks Follow up with cardiology on 05/24/23 at 4PM as scheduled  Please check BMP and CBC in 1-2 weeks Bleeding precautions on apixaban   Discharge Condition: STABLE   CODE STATUS: FULL DIET: heart healthy/carb modified recommended    Brief Hospitalization Summary: Please see all hospital notes, images, labs for full details of the hospitalization. ADMISSION PROVIDER HPI:   62 y.o. male with medical history significant of type 2 diabetes mellitus, hyperlipidemia, obesity who presents to the emergency department due to dry mouth and lightheadedness which started this morning.  Patient states that he was off his diabetes medication for about 3-4 months and was just restarted a few weeks ago.  He works as a Investment banker, operational for [employer] and has been traveling for about a week with little sleep and increased stress.  He woke up this morning feeling like his blood glucose level was elevated, he states that his mouth was dry and he felt lightheaded, so he presented to the ED for further evaluation and management.   ED Course:  In the emergency department, respiratory rate was 24/min, pulse was 69 bpm BP was 146/128, temperature 98.2, O2 sat 95% on room air.  Workup in ED showed sodium 124, potassium 4.3, chloride 91, bicarb 22, glucose 692, BUN 22, creatinine 1.10, GFR greater than 60, anion gap 11.  Lactic acid 3.0, urinalysis positive for glycosuria.  Chest x-ray showed no active disease.  Initial EKG personally reviewed showed sinus tachycardia at a rate of 169 bpm with prolonged QTc .  Patient was treated with IV adenosine and was started on  Cardizem drip after initiation IV 20 mg of Cardizem due to patient going into atrial flutter with rate control.  IV hydration was provided and patient was started on insulin drip due to hyperglycemia.  Hospitalist was asked to admit patient for further evaluation and management.  Hospital Course by problem list  1)Hyperglycemia secondary to HHS/uncontrolled diabetes with persistent hyperglycemia- -recent A1c was 8.4%  -Did Not meet criteria for DKA -Did well with IV insulin and IV fluids -Currently transitioned to subcu insulin -Prior to admission patient was on Trulicity monotherapy but he ran out of Trulicity for almost 3 weeks prior to restarting it about 2 weeks ago 05/01/23 -DC on lantus solostar pen 15 units QHS with insulin pen needles -he will resume weekly trulicity injection  -close outpatient follow up with PCP  -Diabetic educator input appreciated   2)Paroxysmal Atrial flutter--with AVR new onset - CHA2DS2- VASc score   is = 2  (DM2/CHF)   Which is  equal to = 2.2 % annual risk of stroke  This patients CHA2DS2-VASc Score and unadjusted Ischemic Stroke Rate (% per year) is equal to 2.2 % stroke rate/year from a score of 2 -05/01/23 --briefly treated with IV Cardizem at 10 Given low EF will not be prudent to transitioned to oral metoprolol -Given soft BPs will DC on metoprolol 25 mg BID and apixaban 5 mg BID   -Pt was started on apixaban 5 mg BID for full anticoagulation at discharge  -successful TEE/Cardioversion with Dr. Lovina Reach on 5/22.  Pt insists on discharging home after procedure.   -discussed with Dr.  Tenny Craw, ok to discharge home today and outpatient follow up with cardiology already arranged for 05/24/23 at [facility]   3)HFrEF--newly diagnosed systolic dysfunction CHF with EF 35 to 40 %--this may be tachycardia mediated in the setting of atrial flutter with RVR -No mitral stenosis and no aortic stenosis -TSH WNL -CT coronary with calcium score back in May 2021 was  unremarkable with a 0 score and no evidence of CAD or significant coronary plaque or stenosis at that time -If EF remains low with repeat echo in 3 to 4 months after heart rate control may consider LHC to rule out ischemic component -Metoprolol as above #2 -outpatient follow up with cardiology and PCP    4)Prolonged QT Interval QTc   515 ms Avoid QT prolonging drugs Magnesium 1.7--received magnesium to keep serum magnesium close to 2 -Hypokalemia noted--- received potassium and keep potassium close to 4   5)Pseudohyponatremia Na was 124 on admission, corrected sodium level based on CBG of 692 equals 133 -Improved with hydration and with better glycemic control   6) right lower molar dental caries--- amoxicillin as prescribed -Patient will have to wait on dental work given the initiation of Eliquis at this time   Lactic acidosis possibly due to dehydration Lactic acid 3.0 > 2.6 Continue IV hydration and continue to trend lactic acid     Diabetic neuropathy Discuss pregabalin with PCP on outpatient follow up   Hypercholesterolemia Continue fenofibrate, Lovaza and better GLYCEMIC CONTROL   Morbid Obesity- -Low calorie diet, portion control and increase physical activity discussed with patient -Body mass index is 43.12 kg/m.   Discharge Diagnoses:  Principal Problem:   Hyperglycemia Active Problems:   Diabetes mellitus (HCC)   Hyperosmolar hyperglycemic state (HHS) (HCC)   Paroxysmal atrial flutter (HCC)   Prolonged QT interval   Pseudohyponatremia   Lactic acidosis   Dehydration   Hypercholesterolemia   Obesity, Class III, BMI 40-49.9 (morbid obesity) (HCC)   Atrial flutter (HCC)   Discharge Instructions:  Allergies as of 05/02/2023       Reactions   Trazodone Palpitations, Shortness Of Breath        Medication List     STOP taking these medications    glimepiride 2 MG tablet Commonly known as: AMARYL   naproxen sodium 220 MG tablet Commonly known as:  ALEVE   tiZANidine 4 MG capsule Commonly known as: ZANAFLEX       TAKE these medications    amoxicillin 500 MG capsule Commonly known as: AMOXIL Take 1 capsule (500 mg total) by mouth 3 (three) times daily for 6 days.   apixaban 5 MG Tabs tablet Commonly known as: ELIQUIS Take 1 tablet (5 mg total) by mouth 2 (two) times daily.   fenofibrate 48 MG tablet Commonly known as: TRICOR Take 48 mg by mouth daily.   folic acid 1 MG tablet Commonly known as: FOLVITE Take 1 tablet (1 mg total) by mouth daily. Start taking on: May 03, 2023   insulin glargine 100 UNIT/ML Solostar Pen Commonly known as: LANTUS Inject 15 Units into the skin at bedtime. Or as directed by MD   metoprolol tartrate 25 MG tablet Commonly known as: LOPRESSOR Take 1 tablet (25 mg total) by mouth 2 (two) times daily.   Needles & Syringes Misc   Needles & Syringes Misc   omega-3 acid ethyl esters 1 g capsule Commonly known as: LOVAZA Take by mouth.   Pen Needles 31G X 8 MM Misc 1 Units by Does not  apply route as directed.   SYRINGE/NEEDLE (DISP) 1 ML 23G X 1" 1 ML Misc Use to administer testosterone   Syringe/Needle (Disp) 18G X 1" 1 ML Misc Use to draw up testosterone   testosterone cypionate 200 MG/ML injection Commonly known as: DEPOTESTOSTERONE CYPIONATE Inject 0.69mL into muscle every 2 weeks. Use 18 gauge to draw up and 23 gauge to administer. What changed:  how much to take when to take this additional instructions   thiamine 100 MG tablet Commonly known as: Vitamin B-1 Take 1 tablet (100 mg total) by mouth daily. Start taking on: May 03, 2023   Trulicity 1.5 MG/0.5ML Sopn Generic drug: Dulaglutide Inject 1.5 mg into the skin once a week.        Follow-up Information     Shawnie Dapper, PA-C. Schedule an appointment as soon as possible for a visit in 1 day(s).   Specialties: Physician Assistant, Internal Medicine Why: Hospital Follow Up Contact information: [address] [place] [address] [phone]         Sharlene Dory, NP. Go on 05/24/2023.   Specialty: Cardiology Why: Hospital Follow Up at 4:00 pm as scheduled Contact information: [address] [place] [address] [phone]         Rica Records, FNP. Schedule an appointment as soon as possible for a visit in 1 week(s).   Specialty: Family Medicine Why: Hospital Follow Up Contact information: [address] [place] [address] [phone]                Allergies  Allergen Reactions   Trazodone Palpitations and Shortness Of Breath   Allergies as of 05/02/2023       Reactions   Trazodone Palpitations, Shortness Of Breath        Medication List     STOP taking these medications    glimepiride 2 MG tablet Commonly known as: AMARYL   naproxen sodium 220 MG tablet Commonly known as: ALEVE   tiZANidine 4 MG capsule Commonly known as: ZANAFLEX       TAKE these medications    amoxicillin 500 MG capsule Commonly known as: AMOXIL Take 1 capsule (500 mg total) by mouth 3 (three) times daily for 6 days.   apixaban 5 MG Tabs tablet Commonly known as: ELIQUIS Take 1 tablet (5 mg total) by mouth 2 (two) times daily.   fenofibrate 48 MG tablet Commonly known as: TRICOR Take 48 mg by mouth daily.   folic acid 1 MG tablet Commonly known as: FOLVITE Take 1 tablet (1 mg total) by mouth daily. Start taking on: May 03, 2023   insulin glargine 100 UNIT/ML Solostar Pen Commonly known as: LANTUS Inject 15 Units into the skin at bedtime. Or as directed by MD   metoprolol tartrate 25 MG tablet Commonly known as: LOPRESSOR Take 1 tablet (25 mg total) by mouth 2 (two) times daily.   Needles & Syringes Misc   Needles & Syringes Misc   omega-3 acid ethyl esters 1 g capsule Commonly known as: LOVAZA Take by mouth.   Pen Needles 31G X 8 MM Misc 1 Units by Does not apply route as directed.   SYRINGE/NEEDLE  (DISP) 1 ML 23G X 1" 1 ML Misc Use to administer testosterone   Syringe/Needle (Disp) 18G X 1" 1 ML Misc Use to draw up testosterone   testosterone cypionate 200 MG/ML injection Commonly known as: DEPOTESTOSTERONE CYPIONATE Inject 0.41mL into muscle every 2 weeks.  Use 18 gauge to draw up and 23 gauge to administer. What changed:  how much to take when to take this additional instructions   thiamine 100 MG tablet Commonly known as: Vitamin B-1 Take 1 tablet (100 mg total) by mouth daily. Start taking on: May 03, 2023   Trulicity 1.5 MG/0.5ML Sopn Generic drug: Dulaglutide Inject 1.5 mg into the skin once a week.        Procedures/Studies: ECHOCARDIOGRAM COMPLETE  Result Date: 04/30/2023    ECHOCARDIOGRAM REPORT   Patient Name:   David Hammond Date of Exam: 04/30/2023 Medical Rec #:  [id number]     Height:       72.0 in Accession #:    [id number]    Weight:       318.0 lb Date of Birth:  01/30/1961      BSA:          2.595 m Patient Age:    62 years      BP:           156/92 mmHg Patient Gender: M             HR:           57 bpm. [facility] Procedure: 2D Echo, Color Doppler, Cardiac Doppler and Intracardiac            Opacification Agent Indications:    Abnormal ECG R94.31  History:        Patient has no prior history of Echocardiogram examinations.                 Abnormal ECG, Arrythmias:Atrial Fibrillation; Risk                 Factors:Dyslipidemia, Current Smoker, Diabetes and Sleep Apnea.  Sonographer:    David Baba Referring Phys: [id number] COURAGE EMOKPAE  Sonographer Comments: Patient is obese and Technically difficult study due to poor echo windows. IMPRESSIONS  1. Left ventricular ejection fraction, by estimation, is 35 to 40%. The left ventricle has moderately decreased function. Left ventricular endocardial border not optimally defined to evaluate regional wall motion. not well visualized left ventricular hypertrophy. Left ventricular diastolic parameters are  indeterminate.  2. RV not well visualized, grossly appears normal in size and function. . Right ventricular systolic function was not well visualized. The right ventricular size is not well visualized.  3. The mitral valve is normal in structure. Trivial mitral valve regurgitation. No evidence of mitral stenosis.  4. The aortic valve was not well visualized. Aortic valve regurgitation is not visualized. No aortic stenosis is present.  5. Technically difficult study, limited visualization even with echo contrast. FINDINGS  Left Ventricle: Left ventricular ejection fraction, by estimation, is 35 to 40%. The left ventricle has moderately decreased function. Left ventricular endocardial border not optimally defined to evaluate regional wall motion. Definity contrast agent was given IV to delineate the left ventricular endocardial borders. The left ventricular internal cavity size was normal in size. Not well visualized left ventricular hypertrophy. Left ventricular diastolic parameters are indeterminate. Right Ventricle: RV not well visualized, grossly appears normal in size and function. The right ventricular size is not well visualized. Right vetricular wall thickness was not well visualized. Right ventricular systolic function was not well visualized. Left Atrium: Left atrial size was normal in size. Right Atrium: Right atrial size was normal in size. Pericardium: The pericardium was not well visualized. Mitral Valve: The mitral valve is normal in structure. Trivial mitral valve regurgitation. No  evidence of mitral valve stenosis. Tricuspid Valve: The tricuspid valve is normal in structure. Tricuspid valve regurgitation is not demonstrated. No evidence of tricuspid stenosis. Aortic Valve: The aortic valve was not well visualized. Aortic valve regurgitation is not visualized. No aortic stenosis is present. Aortic valve mean gradient measures 4.6 mmHg. Aortic valve peak gradient measures 9.0 mmHg. Aortic valve area, by  VTI measures 1.81 cm. Pulmonic Valve: The pulmonic valve was not well visualized. Pulmonic valve regurgitation is not visualized. No evidence of pulmonic stenosis. Aorta: The aortic root is normal in size and structure. Venous: The inferior vena cava was not well visualized. IAS/Shunts: The interatrial septum was not well visualized.  LEFT VENTRICLE PLAX 2D LVIDd:         4.50 cm   Diastology LVIDs:         3.90 cm   LV e' medial:    10.10 cm/s LV PW:         1.30 cm   LV E/e' medial:  11.0 LV IVS:        1.40 cm   LV e' lateral:   19.00 cm/s LVOT diam:     1.90 cm   LV E/e' lateral: 5.8 LV SV:         39 LV SV Index:   15 LVOT Area:     2.84 cm  RIGHT VENTRICLE RV S prime:     10.60 cm/s TAPSE (M-mode): 2.2 cm LEFT ATRIUM             Index        RIGHT ATRIUM           Index LA diam:        3.60 cm 1.39 cm/m   RA Area:     24.10 cm LA Vol (A2C):   56.5 ml 21.78 ml/m  RA Volume:   70.00 ml  26.98 ml/m LA Vol (A4C):   53.5 ml 20.62 ml/m LA Biplane Vol: 57.7 ml 22.24 ml/m  AORTIC VALVE AV Area (Vmax):    1.74 cm AV Area (Vmean):   1.76 cm AV Area (VTI):     1.81 cm AV Vmax:           150.29 cm/s AV Vmean:          100.377 cm/s AV VTI:            0.215 m AV Peak Grad:      9.0 mmHg AV Mean Grad:      4.6 mmHg LVOT Vmax:         92.03 cm/s LVOT Vmean:        62.300 cm/s LVOT VTI:          0.137 m LVOT/AV VTI ratio: 0.64  AORTA Ao Root diam: 3.50 cm Ao Asc diam:  3.30 cm MITRAL VALVE MV Area (PHT): 5.88 cm     SHUNTS MV Decel Time: 129 msec     Systemic VTI:  0.14 m MV E velocity: 111.00 cm/s  Systemic Diam: 1.90 cm MV A velocity: 42.00 cm/s MV E/A ratio:  2.64 Dina Rich MD Electronically signed by Dina Rich MD Signature Date/Time: 04/30/2023/4:06:57 PM    Final    DG Chest Portable 1 View  Result Date: 04/29/2023 CLINICAL DATA:  Hyperglycemia and tachycardia EXAM: PORTABLE CHEST 1 VIEW COMPARISON:  Chest radiograph dated 08/25/2020 FINDINGS: Slightly low lung volumes. No focal consolidations.  No pleural effusion or pneumothorax. Enlarged cardiomediastinal silhouette is likely projectional. No acute osseous abnormality.  IMPRESSION: No active disease. Electronically Signed   By: Agustin Cree M.D.   On: 04/29/2023 18:19     Subjective: Pt insists on going home today, he tolerated cardioversion/TEE well.    Discharge Exam: Vitals:   05/02/23 1415 05/02/23 1500  BP: 115/88   Pulse: 99   Resp: (!) 21   Temp:  98.3 F (36.8 C)  SpO2: 100%    Vitals:   05/02/23 1411 05/02/23 1412 05/02/23 1415 05/02/23 1500  BP: 111/82  115/88   Pulse: 97 98 99   Resp: 16 17 (!) 21   Temp: 98 F (36.7 C)   98.3 F (36.8 C)  TempSrc:    Oral  SpO2: 100% 99% 100%   Weight:      Height:       General: Pt is alert, awake, not in acute distress Cardiovascular: RRR, S1/S2 +, no rubs, no gallops Respiratory: CTA bilaterally, no wheezing, no rhonchi Abdominal: Soft, NT, ND, bowel sounds + Extremities: no edema, no cyanosis   The results of significant diagnostics from this hospitalization (including imaging, microbiology, ancillary and laboratory) are listed below for reference.     Microbiology: Recent Results (from the past 240 hour(s))  MRSA Next Gen by PCR, Nasal     Status: None   Collection Time: 04/29/23 10:24 PM   Specimen: Nasal Mucosa; Nasal Swab  Result Value Ref Range Status   MRSA by PCR Next Gen NOT DETECTED NOT DETECTED Final    Comment: (NOTE) The GeneXpert MRSA Assay (FDA approved for NASAL specimens only), is one component of a comprehensive MRSA colonization surveillance program. It is not intended to diagnose MRSA infection nor to guide or monitor treatment for MRSA infections. Test performance is not FDA approved in patients less than 13 years old. Performed at [facility], [address] [place], [place] [address]      Labs: BNP (last 3 results) No results for input(s): "BNP" in the last 8760 hours. Basic Metabolic Panel: Recent Labs  Lab 04/30/23 0118  04/30/23 0453 04/30/23 0832 05/01/23 0834 05/02/23 0426  NA 132* 132* 130* 128* 132*  K 3.1* 3.1* 3.5 4.4 3.8  CL 96* 98 98 98 100  CO2 25 26 24 22 25   GLUCOSE 198* 157* 165* 278* 157*  BUN 17 15 14 14 16   CREATININE 0.79 0.74 0.65 0.78 0.69  CALCIUM 8.1* 8.1* 8.0* 8.1* 8.1*  MG  --  1.7  --   --   --   PHOS  --  3.7  --  2.9  --    Liver Function Tests: Recent Labs  Lab 05/01/23 0834  ALBUMIN 3.1*   No results for input(s): "LIPASE", "AMYLASE" in the last 168 hours. No results for input(s): "AMMONIA" in the last 168 hours. CBC: Recent Labs  Lab 04/29/23 1659 04/30/23 0453  WBC 6.7 7.2  NEUTROABS 4.1  --   HGB 18.2* 17.1*  HCT 53.1* 50.0  MCV 92.7 93.6  PLT 180 168   Cardiac Enzymes: No results for input(s): "CKTOTAL", "CKMB", "CKMBINDEX", "TROPONINI" in the last 168 hours. BNP: Invalid input(s): "POCBNP" CBG: Recent Labs  Lab 05/02/23 0727 05/02/23 1137 05/02/23 1221 05/02/23 1412 05/02/23 1548  GLUCAP 200* 149* 147* 119* 197*   D-Dimer No results for input(s): "DDIMER" in the last 72 hours. Hgb A1c No results for input(s): "HGBA1C" in the last 72 hours. Lipid Profile No results for input(s): "CHOL", "HDL", "LDLCALC", "TRIG", "CHOLHDL", "LDLDIRECT" in the last 72 hours. Thyroid function  studies Recent Labs    04/30/23 0832  TSH 1.309   Anemia work up No results for input(s): "VITAMINB12", "FOLATE", "FERRITIN", "TIBC", "IRON", "RETICCTPCT" in the last 72 hours. Urinalysis    Component Value Date/Time   COLORURINE YELLOW 04/29/2023 1806   APPEARANCEUR CLEAR 04/29/2023 1806   LABSPEC <1.005 (L) 04/29/2023 1806   PHURINE 6.0 04/29/2023 1806   GLUCOSEU >=500 (A) 04/29/2023 1806   HGBUR NEGATIVE 04/29/2023 1806   BILIRUBINUR NEGATIVE 04/29/2023 1806   KETONESUR NEGATIVE 04/29/2023 1806   PROTEINUR NEGATIVE 04/29/2023 1806   NITRITE NEGATIVE 04/29/2023 1806   LEUKOCYTESUR NEGATIVE 04/29/2023 1806   Sepsis Labs Recent Labs  Lab 04/29/23 1659  04/30/23 0453  WBC 6.7 7.2   Microbiology Recent Results (from the past 240 hour(s))  MRSA Next Gen by PCR, Nasal     Status: None   Collection Time: 04/29/23 10:24 PM   Specimen: Nasal Mucosa; Nasal Swab  Result Value Ref Range Status   MRSA by PCR Next Gen NOT DETECTED NOT DETECTED Final    Comment: (NOTE) The GeneXpert MRSA Assay (FDA approved for NASAL specimens only), is one component of a comprehensive MRSA colonization surveillance program. It is not intended to diagnose MRSA infection nor to guide or monitor treatment for MRSA infections. Test performance is not FDA approved in patients less than 36 years old. Performed at [facility], [address], [place], [place] [address]    Time coordinating discharge: 44 mins   SIGNED:  Standley Dakins, MD  [facility] 05/02/2023, 4:17 PM How to contact the [facility] Attending or Consulting provider 7A - 7P or covering provider during after hours 7P -7A, for this patient?  Check the care team in [facility] and look for a) attending/consulting [facility] provider listed and b) the [facility], The team listed Log into [email or web address] and use [place]'s universal password to access. If you do not have the password, please contact the hospital operator. Locate the [facility] provider you are looking for under [facility] and page to a number that you can be directly reached. If you still have difficulty reaching the provider, please page the [facility] (Director on Call) for the Hospitalists listed on amion for assistance.

## 2023-05-02 NOTE — Progress Notes (Addendum)
ANTICOAGULATION CONSULT NOTE  Pharmacy Consult for Enoxaparin Indication: atrial fibrillation  Allergies  Allergen Reactions   Trazodone Palpitations and Shortness Of Breath    Patient Measurements: Height: 6' (182.9 cm) Weight: (!) 144.2 kg (317 lb 14.5 oz) IBW/kg (Calculated) : 77.6  Vital Signs: Temp: 98.1 F (36.7 C) (05/22 0728) Temp Source: Oral (05/22 0728) BP: 116/64 (05/22 0600)  Labs: Recent Labs    04/29/23 1659 04/29/23 1729 04/29/23 2028 04/30/23 0453 04/30/23 0832 05/01/23 0834 05/01/23 1503 05/02/23 0426  HGB 18.2*  --   --  17.1*  --   --   --   --   HCT 53.1*  --   --  50.0  --   --   --   --   PLT 180  --   --  168  --   --   --   --   LABPROT  --   --   --   --   --   --  14.0  --   INR  --   --   --   --   --   --  1.1  --   CREATININE 1.10  --    < > 0.74 0.65 0.78  --  0.69  TROPONINIHS  --  42*  --   --   --   --   --   --    < > = values in this interval not displayed.     Estimated Creatinine Clearance: 142.9 mL/min (by C-G formula based on SCr of 0.69 mg/dL).   Assessment: 62 YO male not on anticoagulation PTA who presented with hyperglycemia and SVT found to have new atrial fibrillation/flutter. Pharmacy consulted to start therapeutic enoxaparin.   Renal function normal, CBC not checked from admission, will recheck in am. No bleeding issues noted.   Goal of Therapy:  Anti-Xa level 0.6-1 units/ml 4hrs after LMWH dose given Monitor platelets by anticoagulation protocol: Yes   Plan:  Enoxaparin 140 mg SQ Q12h for now Follow up transition to doac prior to dc  Patient has high copay's for medications (SGLT2i's would require a prior authorization) however he would be able to utilize copay cards given commercial insurance bringing his costs down and affordable.   Addendum:  Patient now s/p TEE/DCCV. New orders to change to apixaban this evening. No dose adjustments needed.    Sheppard Coil PharmD., BCPS Clinical  Pharmacist 05/02/2023 8:33 AM

## 2023-05-02 NOTE — Progress Notes (Signed)
Electrical Cardioversion Procedure Note David Hammond [id number] 1961/11/07  Procedure: Electrical Cardioversion Indications:  Atrial Flutter  Procedure Details Consent: Risks of procedure as well as the alternatives and risks of each were explained to the (patient/caregiver).  Consent for procedure obtained. Time Out: Verified patient identification, verified procedure, site/side was marked, verified correct patient position, special equipment/implants available, medications/allergies/relevent history reviewed, required imaging and test results available.  Performed  Patient placed on cardiac monitor, pulse oximetry, supplemental oxygen as necessary.  Sedation given: Short-acting barbiturates Pacer pads placed anterior and posterior chest.  Cardioverted 1 time(s).  Cardioverted at 200J.  Evaluation Findings: Post procedure EKG shows: NSR Complications: None Patient did tolerate procedure well.   Trenton Gammon S 05/02/2023, 2:06 PM

## 2023-05-02 NOTE — Progress Notes (Signed)
Pt did well overnight. Pt ambulated around nurse's station about 3-4 laps. HR maintained in low to mid 90s. No complaints from pt throughout night.

## 2023-05-02 NOTE — Progress Notes (Addendum)
Rounding Note    Patient Name: Janssen Puls Date of Encounter: 05/02/2023  [facility] Cardiologist: Prentice Docker, MD (Inactive)   Subjective   Patient seen on a.m. rounds.  Denies any chest pain or shortness of breath.  No overnight events recorded.  Was up ambulating around the unit without difficulty.  Scheduled for TEE/DCCV today  Inpatient Medications    Scheduled Meds:  amoxicillin  500 mg Oral Q8H   Chlorhexidine Gluconate Cloth  6 each Topical Daily   enoxaparin (LOVENOX) injection  140 mg Subcutaneous Q12H   fenofibrate  54 mg Oral Daily   folic acid  1 mg Oral Daily   insulin aspart  0-5 Units Subcutaneous QHS   insulin aspart  0-9 Units Subcutaneous TID WC   insulin aspart  3 Units Subcutaneous TID WC   insulin glargine-yfgn  14 Units Subcutaneous BID   metoprolol tartrate  25 mg Oral Q6H   nicotine  21 mg Transdermal Daily   omega-3 acid ethyl esters  1 g Oral Daily   pregabalin  75 mg Oral BID   thiamine  100 mg Oral Daily   Continuous Infusions:  sodium chloride 20 mL/hr at 05/01/23 1927   sodium chloride 50 mL/hr at 05/01/23 1927   diltiazem (CARDIZEM) infusion Stopped (05/01/23 1709)   PRN Meds: acetaminophen **OR** acetaminophen, prochlorperazine   Vital Signs    Vitals:   05/02/23 0300 05/02/23 0400 05/02/23 0500 05/02/23 0600  BP: 102/66 98/69 122/74 116/64  Pulse:      Resp:   15 15  Temp: (!) 97.4 F (36.3 C)     TempSrc: Oral     SpO2:      Weight:      Height:        Intake/Output Summary (Last 24 hours) at 05/02/2023 0732 Last data filed at 05/02/2023 0310 Gross per 24 hour  Intake 705.73 ml  Output 2250 ml  Net -1544.27 ml      05/01/2023    5:14 AM 04/29/2023    4:47 PM 04/29/2023    4:38 PM  Last 3 Weights  Weight (lbs) 317 lb 14.5 oz 318 lb 318 lb  Weight (kg) 144.2 kg 144.244 kg 144.244 kg      Telemetry    Atrial flutter rate negative heart- Personally Reviewed  ECG    No new tracings- Personally  Reviewed  Physical Exam   GEN: No acute distress.   Neck: No JVD Cardiac: IR IR, no murmurs, rubs, or gallops.  Respiratory: Clear to auscultation bilaterally. GI: Soft, nontender, obese, non-distended  MS: No edema; No deformity. Neuro:  Nonfocal  Psych: Normal affect   Labs    High Sensitivity Troponin:   Recent Labs  Lab 04/29/23 1729  TROPONINIHS 42*     Chemistry Recent Labs  Lab 04/30/23 0453 04/30/23 0832 05/01/23 0834 05/02/23 0426  NA 132* 130* 128* 132*  K 3.1* 3.5 4.4 3.8  CL 98 98 98 100  CO2 26 24 22 25   GLUCOSE 157* 165* 278* 157*  BUN 15 14 14 16   CREATININE 0.74 0.65 0.78 0.69  CALCIUM 8.1* 8.0* 8.1* 8.1*  MG 1.7  --   --   --   ALBUMIN  --   --  3.1*  --   GFRNONAA >60 >60 >60 >60  ANIONGAP 8 8 8 7     Lipids No results for input(s): "CHOL", "TRIG", "HDL", "LABVLDL", "LDLCALC", "CHOLHDL" in the last 168 hours.  Hematology Recent Labs  Lab 04/29/23 1659 04/30/23 0453  WBC 6.7 7.2  RBC 5.73 5.34  HGB 18.2* 17.1*  HCT 53.1* 50.0  MCV 92.7 93.6  MCH 31.8 32.0  MCHC 34.3 34.2  RDW 12.4 12.3  PLT 180 168   Thyroid  Recent Labs  Lab 04/30/23 0832  TSH 1.309    BNPNo results for input(s): "BNP", "PROBNP" in the last 168 hours.  DDimer No results for input(s): "DDIMER" in the last 168 hours.   Radiology      Cardiac Studies   Echo 04/30/23  1. Left ventricular ejection fraction, by estimation, is 35 to 40%. The  left ventricle has moderately decreased function. Left ventricular  endocardial border not optimally defined to evaluate regional wall motion.  not well visualized left ventricular  hypertrophy. Left ventricular diastolic parameters are indeterminate.   2. RV not well visualized, grossly appears normal in size and function. .  Right ventricular systolic function was not well visualized. The right  ventricular size is not well visualized.   3. The mitral valve is normal in structure. Trivial mitral valve  regurgitation. No  evidence of mitral stenosis.   4. The aortic valve was not well visualized. Aortic valve regurgitation  is not visualized. No aortic stenosis is present.   5. Technically difficult study, limited visualization even with echo  contrast.  CCTA   May 2021 FINDINGS: Coronary calcium score: The patient's coronary artery calcium score is 0, which places the patient in the 0 percentile.   Coronary arteries: Normal coronary origins.  Left dominance.   Right Coronary Artery: Small, nondominant vessel. No significant plaque or stenosis.   Left Main Coronary Artery: Normal caliber vessel. No significant plaque or stenosis.   Left Anterior Descending Coronary Artery: Normal caliber vessel. No significant plaque or stenosis. Gives rise to 1 large D1/ramus and 1 small D2 diagonal branches. Distal LAD wraps apex.   Left Circumflex Artery: Normal caliber vessel, gives rise to PDA. No significant plaque or stenosis. Gives rise to 1 large and 1 small OM branches.   Aorta: Normal size, 31 mm at the mid ascending aorta (level of the PA bifurcation) measured double oblique. No calcifications. No dissection.   Aortic Valve: No calcifications. Trileaflet.   Other findings:   Normal pulmonary vein drainage into the left atrium.   Normal left atrial appendage without a thrombus.   Normal size of the pulmonary artery.   RA/RV appear mildly enlarged.   IMPRESSION: 1. No evidence of CAD, CADRADS = 0.   2. Coronary calcium score of 0. This was 0 percentile for age and sex matched control.   3. Normal coronary origin with left dominance.   4. RA/RV appear mildly enlarged, with normal PA size. Recommend clinical correlation    Patient Profile     62 y.o. male with past medical history of type 2 diabetes, possibly hypertension, obstructive sleep apnea uses CPAP, who has been seen and evaluated for atrial flutter and HFrEF.  Assessment & Plan    Atrial flutter -Remains in atrial  flutter -Unsure of duration -Diltiazem stopped 5/21 1709 -Continued on metoprolol tartrate 25 mg every 6 hours -Continued on Lovenox 140 SQ every 12 hours -Will need to be transitioned to oral  anticoagulant prior to discharge for CHA2DS2-VASc score of 2 -Scheduled for TEE/DCCV today, further recommendations to follow  HFrEF -LVEF 35-40% on echocardiogram -Question if maybe tachycardia induced from a flutter -Will reevaluate LV function with TEE -Currently appears euvolemic on exam -Further recommendations to  follow test today -Daily weights, I&O's, low sodium diet  Dyslipidemia -Continue fenofibrate and lovaza -On arrival patient with extremely elevated blood sugars recommend recheck fasting lipid panel after discharge to reassess  Obstructive sleep apnea -Continue on CPAP nightly  Type 2 diabetes -Continued on insulin -Management per IM     For questions or updates, please contact [facility] Please consult [email or web address] for contact info under        Signed, SHERI HAMMOCK, NP  05/02/2023, 7:32 AM    Patient seen and examined   I agree with findings of S [place] above   Pt feels OK  In hallway walkng   Would like to proceed with cardioversion to et back in NSR sooner   Neck:  Lungs are CTA Card  Irreg irreg  No murmurs   Abd is benign  No hepatomegaly  Ext with chronic skin changes   Some ruddiness  Tr pitting edema  Atrial flutter    Plan for TEE cardioversion today  HFrEF Echo was difficlt   Will get another look at LV function with TEE Will adjust meds after procedure    Will make sure he has follow up as outpt  Dietrich Pates MD

## 2023-05-02 NOTE — Anesthesia Procedure Notes (Addendum)
Procedure Name: Intubation Date/Time: 05/02/2023 1:50 PM  Performed by: Cy Blamer, CRNAPre-anesthesia Checklist: Patient identified, Emergency Drugs available, Suction available and Patient being monitored Patient Re-evaluated:Patient Re-evaluated prior to induction Oxygen Delivery Method: Circle system utilized Preoxygenation: Pre-oxygenation with 100% oxygen Induction Type: IV induction and Rapid sequence Laryngoscope Size: Glidescope and 4 Tube type: Oral Tube size: 8.0 mm Number of attempts: 1 Airway Equipment and Method: Stylet, Video-laryngoscopy and Bite block Placement Confirmation: ETT inserted through vocal cords under direct vision, positive ETCO2 and breath sounds checked- equal and bilateral Secured at: 24 cm Tube secured with: Tape Dental Injury: Teeth and Oropharynx as per pre-operative assessment  Comments: Elective RSI for GLP-1 Agonist on 04/29/23

## 2023-05-02 NOTE — Anesthesia Postprocedure Evaluation (Signed)
Anesthesia Post Note  Patient: David Hammond  Procedure(s) Performed: CARDIOVERSION TRANSESOPHAGEAL ECHOCARDIOGRAM (TEE)  Patient location during evaluation: Phase II Anesthesia Type: General Level of consciousness: awake and alert and oriented Pain management: pain level controlled Vital Signs Assessment: post-procedure vital signs reviewed and stable Respiratory status: spontaneous breathing, nonlabored ventilation and respiratory function stable Cardiovascular status: blood pressure returned to baseline and stable Postop Assessment: no apparent nausea or vomiting Anesthetic complications: no  No notable events documented.   Last Vitals:  Vitals:   05/02/23 1233 05/02/23 1411  BP: 116/87 111/82  Pulse:  97  Resp:  16  Temp:  36.7 C  SpO2:  100%    Last Pain:  Vitals:   05/02/23 1411  TempSrc:   PainSc: 0-No pain                 Panhia Karl C Burech Mcfarland

## 2023-05-03 ENCOUNTER — Telehealth: Payer: Self-pay | Admitting: Internal Medicine

## 2023-05-03 ENCOUNTER — Other Ambulatory Visit (HOSPITAL_COMMUNITY): Payer: Self-pay

## 2023-05-03 DIAGNOSIS — Z1330 Encounter for screening examination for mental health and behavioral disorders, unspecified: Secondary | ICD-10-CM | POA: Diagnosis not present

## 2023-05-03 DIAGNOSIS — E1142 Type 2 diabetes mellitus with diabetic polyneuropathy: Secondary | ICD-10-CM | POA: Diagnosis not present

## 2023-05-03 DIAGNOSIS — I4892 Unspecified atrial flutter: Secondary | ICD-10-CM | POA: Diagnosis not present

## 2023-05-03 NOTE — Telephone Encounter (Signed)
Pt called in stating his PCP suggested he have a zio monitor put on so he can have "some data to go off" before his f/u 05/24/23. Please advise if this can be ordered.

## 2023-05-04 NOTE — Telephone Encounter (Signed)
Pt made of aware of MD recommendation, he agreeable to plan and will see [place] at 6/13 appt.

## 2023-05-04 NOTE — Telephone Encounter (Signed)
I would not recomm a Zio patch right now   He needs to be on anticoagulatoin now Follow up in [facility] on June 13 first   Will get EKG then

## 2023-05-08 ENCOUNTER — Encounter: Payer: Self-pay | Admitting: Physician Assistant

## 2023-05-08 ENCOUNTER — Telehealth: Payer: Self-pay | Admitting: Family Medicine

## 2023-05-08 NOTE — Progress Notes (Unsigned)
Cardiology Office Note    Date:  05/09/2023   ID:  David Hammond, DOB 1961/03/04, MRN [id number]  PCP:  Shawnie Dapper, PA-C  Cardiologist:  Dietrich Pates, MD  Electrophysiologist:  None   Chief Complaint: f/u atrial flutter  History of Present Illness:   David Hammond is a 62 y.o. male with history of HTN, dyslipidemia (managed by PCP), DM2, morbid obesity with OSA (uses CPAP), severe hepatic steatosis by prior CT, recently dx atrial flutter with new cardiomyopathy/HFrEF seen for post-hospital f/u.   He was remotely seen in [facility] office for chest pain and had coronary CT 04/2020 with no evidence of CAD, + severe hepatic steatosis, possible mild RV/RA enlargement with normal PA size. He denies formal hx of HTN. Prior SBP range 120s-140s. He was admitted earlier this month with dry mouth, dizziness after being off DM meds for several months, and severely elevated blood sugar. His primary care provider had left the practice earlier this year and he ran into issues trying to obtain ongoing refills. CBG was over 600 during admission. He was found to be in new onset atrial flutter. Echo 04/30/23 EF 35-40% but visually challenging. He was started on rate control and anticoagulation and underwent TEE/DCCV on 05/02/23 which showed EF moderately reduced, mild MR, s/p successful conversion to NSR. He was discharged on Eliquis + metoprolol tartrate from cardiac standpoint. He saw PCP back in follow-up whom he reports recommended to start fish oil, magnesium glycinate, and potassium. He has not purchased these yet. He was intolerant of the fenofibrate that they trialed due to nausea within just a few days of starting.  He returns for follow-up today with his wife David Hammond. He overall reports continuing to feel significantly better than before was admitted. He reports his goal is to come off as many medications as possible. He watched his dad struggle with adding a lot of medicines to chase down  continued problems to fix the one before it. He does report concern over fatigue and some generalized blurred vision that occurs shortly after he takes metoprolol and Eliquis. He is also concerned about ED. The blurred vision resolves within a short while after taking the medicine, no other intervention needed. He has no other focal neurologic deficits. No aphasia, facial asymmetry, tunnel vision, curtains over his vision, or full vision loss. He states he is not concerned this represents stroke and would not go back to the hospital for this. He is not describing any CP, SOB, dizziness, pre-syncope, or syncope. He has been wearing a smartwatch to track his HR. He reports he was previously drinking more regular alcohol but he reports he has significantly cut this down. 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 He and his wife reiterate they prefer to base their decisions on faith and facts, not fear. 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 He is compliant with CPAP.  Labwork independently reviewed: 04/2023 NA 132, K 3.8, Cr 0.69, TSH OK, MG 1.7, Hgb 17.1, plt 168 03/2023 lipids managed by PCP - LDL 78, trig 441  Past History   Past Medical History:  Diagnosis Date   Allergy 2016   Trazodone   Arthritis 1967   Knees   Atrial flutter (HCC)    Cardiomyopathy (HCC)    Chronic HFrEF (heart failure with reduced  ejection fraction) (HCC)    Diabetes mellitus (HCC) 2019   GERD (gastroesophageal reflux disease) 2015   Hepatic steatosis    Hypercholesterolemia 04/29/2023   Morbid obesity (HCC)    Sleep apnea 2015   Controlled with Tx CPAP    Past Surgical History:  Procedure Laterality Date   HERNIA REPAIR  2012   Umbilical   VASECTOMY      Current Medications: Current Meds  Medication Sig   apixaban (ELIQUIS) 5 MG TABS tablet Take 1 tablet  (5 mg total) by mouth 2 (two) times daily.   Continuous Glucose Sensor (FREESTYLE LIBRE 3 SENSOR) MISC by Does not apply route.   Dulaglutide (TRULICITY) 1.5 MG/0.5ML SOPN Inject 1.5 mg into the skin once a week.   folic acid (FOLVITE) 1 MG tablet Take 1 tablet (1 mg total) by mouth daily.   Insulin Glargine (BASAGLAR KWIKPEN) 100 UNIT/ML Inject 15 Units into the skin at bedtime.   Insulin Pen Needle (PEN NEEDLES) 31G X 8 MM MISC 1 Units by Does not apply route as directed.   metoprolol tartrate (LOPRESSOR) 25 MG tablet Take 1 tablet (25 mg total) by mouth 2 (two) times daily.   Needles & Syringes MISC    Needles & Syringes MISC    SYRINGE/NEEDLE, DISP, 1 ML 23G X 1" 1 ML MISC Use to administer testosterone   Syringe/Needle, Disp, 18G X 1" 1 ML MISC Use to draw up testosterone   Testosterone Cypionate 200 MG/ML SOLN Inject 0.5 mLs into the muscle every 7 (seven) days.   thiamine (VITAMIN B-1) 100 MG tablet Take 1 tablet (100 mg total) by mouth daily.   [DISCONTINUED] omega-3 acid ethyl esters (LOVAZA) 1 g capsule Take by mouth. Not yet taking    Allergies:   Trazodone and Fenofibrate   Social History   Socioeconomic History   Marital status: Married    Spouse name: Not on file   Number of children: Not on file   Years of education: Not on file   Highest education level: Not on file  Occupational History   Not on file  Tobacco Use   Smoking status: Light Smoker    Packs/day: 0.00    Years: 40.00    Additional pack years: 0.00    Total pack years: 0.00    Types: Pipe, Cigarettes   Smokeless tobacco: Never   Tobacco comments:    1-2 pipes per day. Used to smoke cigarettes. Quit about 2015. Use nicotine replacement gum/mints.  Vaping Use   Vaping Use: Never used  Substance and Sexual Activity   Alcohol use: Yes   Drug use: Never   Sexual activity: Yes    Partners: Female    Birth control/protection: None    Comment: Vasectomy  Other Topics Concern   Not on file  Social  History Narrative   Not on file   Social Determinants of Health   Financial Resource Strain: Low Risk  (06/03/2019)   Overall Financial Resource Strain (CARDIA)    Difficulty of Paying Living Expenses: Not hard at all  Food Insecurity: No Food Insecurity (04/30/2023)   Hunger Vital Sign    Worried About Running Out of Food in the Last Year: Never true    Ran Out of Food in the Last Year: Never true  Transportation Needs: No Transportation Needs (04/30/2023)   PRAPARE - Administrator, Civil Service (Medical): No    Lack of Transportation (Non-Medical): No  Physical Activity: Unknown (06/03/2019)  Exercise Vital Sign    Days of Exercise per Week: 7 days    Minutes of Exercise per Session: Not on file  Stress: No Stress Concern Present (06/03/2019)   [employer] of Occupational Health - Occupational Stress Questionnaire    Feeling of Stress : Only a little  Social Connections: Unknown (06/03/2019)   Social Connection and Isolation Panel [NHANES]    Frequency of Communication with Friends and Family: Three times a week    Frequency of Social Gatherings with Friends and Family: Twice a week    Attends Religious Services: Not on Marketing executive or Organizations: Not on file    Attends Banker Meetings: Not on file    Marital Status: Married     Family History:  The patient's family history includes Arthritis in his father and mother; COPD in his father; Cancer in his father; Diabetes in his father; Heart disease in his father; Hyperlipidemia in his father; Hypertension in his father; Kidney disease in his father; Stroke in his father; Varicose Veins in his mother.  ROS:   Please see the history of present illness.  All other systems are reviewed and otherwise negative.    EKG(s)/Additional Testing   EKG:  EKG is ordered today, personally reviewed, demonstrating NSR 85bpm, no acute STT Changes, QTc .  CV Studies: Cardiac studies  reviewed are outlined and summarized above. Otherwise please see EMR for full report.  Recent Labs: 04/06/2023: ALT 85 04/30/2023: Hemoglobin 17.1; Magnesium 1.7; Platelets 168; TSH 1.309 05/02/2023: BUN 16; Creatinine, Ser 0.69; Potassium 3.8; Sodium 132  Recent Lipid Panel    Component Value Date/Time   CHOL 211 (H) 04/06/2023 0807   TRIG 441 (H) 04/06/2023 0807   HDL 63 04/06/2023 0807   CHOLHDL 3.3 04/06/2023 0807   LDLCALC 78 04/06/2023 0807    PHYSICAL EXAM:    VS:  BP 122/74   Pulse 85   Ht 6' (1.829 m)   Wt (!) 307 lb (139.3 kg)   SpO2 94%   BMI 41.64 kg/m   BMI: Body mass index is 41.64 kg/m.  GEN: Well nourished, well developed male in no acute distress HEENT: normocephalic, atraumatic Neck: no JVD, carotid bruits, or masses Cardiac: RRR; no murmurs, rubs, or gallops, trace sockline edema  Respiratory:  clear to auscultation bilaterally, normal work of breathing GI: soft, nontender, nondistended, + BS MS: no deformity or atrophy Skin: warm and dry, no rash Neuro:  Alert and Oriented x 3, Strength and sensation are intact, follows commands Psych: euthymic mood, full affect  Wt Readings from Last 3 Encounters:  05/09/23 (!) 307 lb (139.3 kg)  05/02/23 (!) 317 lb 14.5 oz (144.2 kg)  04/05/23 (!) 312 lb (141.5 kg)     ASSESSMENT & PLAN:   1. Paroxysmal atrial flutter - maintaining NSR post cardioversion. CHADSVASC is 3 with DM, CHF, ?possible borderline HTN. He denies formal hx of HTN. Prior SBP trends do periodically track for borderline hypertension given the current guidelines. Regardless, with score over 2, continuation of anticoagulation is warranted for stroke risk reduction. We discussed referral to EP to consider ablation which may provide opportunity to eventually come off of Eliquis at some point. However, he is aware he is not to stop the medication at this time, especially not within the first 4 weeks post-cardioversion due to risk of stroke. Regarding  Lopressor, he is having some side effects after the AM dose described above. He is not  having any other focal deficits with this and did not want to seek higher level of care for evaluation of this. It completely resolves for the rest of the day. He is concerned about the side effect of ED with beta blockers which is a known side effect. Given his LV dysfunction I would favor continuing beta blocker for now until we see whether his EF has improved, as diltiazem could potentially worsen heart failure symptoms. However, per shared decision making, we'll change Lopressor 25mg  BID to Toprol 50mg  nightly to see if this helps. He is agreeable to this. If this does not help, can consider reducing Toprol further to 25mg  daily. If his EF eventually returns to normal (see timing of repeat echo below) and he is still not tolerating beta blocker I think a trial of diltiazem would then be reasonable. We'll also update CMET, Mg, CBC today to help guide recommendations for electrolyte repletion. We discussed that the non-pharmacologic interventions of alcohol reduction, achieving/maintaining a healthy weight, and regular activity are all measures that will help promote long term heart health. He also inquires about potentially having a tooth removed. We discussed that extraction of 1-2 teeth generally do not require holding of OAC. He was advised to have his dentist reach out with final details of procedure if needed. OK to forward to me to review if this comes in, if anything more complex than simple extraction. No SBE ppx needs identified.  2. Chronic HFrEF/cardiomyopathy - volume status appears stable. We are changing Lopressor to Toprol as above. He is very clear he does not wish to add additional medication at this time. Therefore did not intensify GDMT today. We discussed the rationale behind guideline-directed medical therapy and potential risk of worsening HF/mortality in this disease process. He states he is not  confident that the hospital echocardiogram was accurate and inquires about recheck. The images were technically difficult. TEE did show concern for moderate LV dysfunction as well. I think it is too early to repeat it this close to cardioversion but will plan to repeat echo in 1 month to determine if there has been clinical improvement. 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 If his EF remains low, we will need to revisit updated ischemic testing though I think underlying CAD seems less likely given the normal coronary CTA in 2021 and no clear recent angina. CHF information relayed on AVS.  3. Borderline hypertension - controlled today. Commentary as above regarding prior trends. Follow with transition to Toprol.  4. Hepatic steatosis - will recheck CMET today given reduction in ETOH use. Otherwise, recommended he continue to f/u with primary care for this as well as dyslipidemia, elevated triglycerides and DM.    Disposition: F/u with EP referral as above, otherwise 3 months in [place] office with Dr. Tenny Craw or APP.   Medication Adjustments/Labs and Tests Ordered: Current medicines are reviewed at length with the patient today.  Concerns regarding medicines are outlined above. Medication changes, Labs and Tests ordered today are summarized above and listed in the Patient Instructions accessible in Encounters.   Signed, Laurann Montana, PA-C  05/09/2023 2:31 PM    [facility] Phone: [phone]; Fax: [phone]

## 2023-05-08 NOTE — Telephone Encounter (Signed)
Tried calling patient back, VM is full. We do not send cologuard, just the order. Please recommend contacting the company that sent it to them.

## 2023-05-08 NOTE — Telephone Encounter (Signed)
Patient spouse called to cancel no longer being seen here in our office, but received a colo guard box and does not want to be charged for this, what does patient need to do send it back to [place]? Please contact patient at  [phone] and let them know what they need to do.

## 2023-05-09 ENCOUNTER — Ambulatory Visit: Payer: Federal, State, Local not specified - PPO | Attending: Nurse Practitioner | Admitting: Physician Assistant

## 2023-05-09 ENCOUNTER — Encounter: Payer: Self-pay | Admitting: Physician Assistant

## 2023-05-09 VITALS — BP 122/74 | HR 85 | Ht 72.0 in | Wt 307.0 lb

## 2023-05-09 DIAGNOSIS — I4892 Unspecified atrial flutter: Secondary | ICD-10-CM

## 2023-05-09 DIAGNOSIS — I429 Cardiomyopathy, unspecified: Secondary | ICD-10-CM

## 2023-05-09 DIAGNOSIS — R030 Elevated blood-pressure reading, without diagnosis of hypertension: Secondary | ICD-10-CM | POA: Diagnosis not present

## 2023-05-09 DIAGNOSIS — I5022 Chronic systolic (congestive) heart failure: Secondary | ICD-10-CM

## 2023-05-09 DIAGNOSIS — K760 Fatty (change of) liver, not elsewhere classified: Secondary | ICD-10-CM | POA: Diagnosis not present

## 2023-05-09 MED ORDER — METOPROLOL SUCCINATE ER 50 MG PO TB24: 50.0000 mg | ORAL_TABLET | Freq: Every day | ORAL | 3 refills | Status: DC

## 2023-05-09 NOTE — Patient Instructions (Signed)
Medication Instructions:  Your physician has recommended you make the following change in your medication:   1) STOP metoprolol tartrate (Lopressor) 2) START metoprolol succinate (Toprol XL) 50mg  at bedtime  *If you need a refill on your cardiac medications before your next appointment, please call your pharmacy*  Lab Work: TODAY: CMET, Magnesium, CBC If you have labs (blood work) drawn today and your tests are completely normal, you will receive your results only by: MyChart Message (if you have MyChart) OR A paper copy in the mail If you have any lab test that is abnormal or we need to change your treatment, we will call you to review the results.  Testing/Procedures: Your physician has requested that you have an echocardiogram in 1 month. Echocardiography is a painless test that uses sound waves to create images of your heart. It provides your doctor with information about the size and shape of your heart and how well your heart's chambers and valves are working. This procedure takes approximately one hour. There are no restrictions for this procedure. Please do NOT wear cologne, perfume, aftershave, or lotions (deodorant is allowed). Please arrive 15 minutes prior to your appointment time.   Follow-Up: At [facility], you and your health needs are our priority.  As part of our continuing mission to provide you with exceptional heart care, we have created designated Provider Care Teams.  These Care Teams include your primary Cardiologist (physician) and Advanced Practice Providers (APPs -  Physician Assistants and Nurse Practitioners) who all work together to provide you with the care you need, when you need it.  Your next appointment:   3 month(s) in [place]  The format for your next appointment:   In Person  Provider:   Dietrich Pates, MD or APP {  Other Instructions You have been referred to our electrophysiology team, the scheduler will call you to schedule an appointment  to see one of our EP doctors.  For patients with weak heart pump function, we give them these special instructions:  1. Follow a low-salt diet - you should be eating no more than 2,000mg  of sodium per day. This does not necessarily just apply to the salt you put on top of prepared food, but the sodium already in food. Processed food, frozen meals, canned goods, deli meat, and bread can have a surprising amount of sodium per serving so be sure to track this daily. 2. Watch your fluid intake. In general, you should not be taking in more than 2 liters of fluid per day (close to 64 oz of fluid per day). This includes sources of water in foods like soup, coffee, tea, milk, etc. It's important to stay hydrated but NOT to excess. 2. Weigh yourself on the same scale at same time of day and keep a log. 3. Call your doctor: (Anytime you feel any of the following symptoms)  - 3lb weight gain overnight or 5lb within a few days - Shortness of breath, with or without a dry hacking cough  - Swelling in the hands, feet or stomach  - If you have to sleep on extra pillows at night in order to breathe   IT IS IMPORTANT TO LET YOUR DOCTOR KNOW EARLY ON IF YOU ARE HAVING SYMPTOMS SO WE CAN HELP YOU!

## 2023-05-10 ENCOUNTER — Other Ambulatory Visit: Payer: Self-pay

## 2023-05-10 DIAGNOSIS — R718 Other abnormality of red blood cells: Secondary | ICD-10-CM

## 2023-05-10 DIAGNOSIS — K760 Fatty (change of) liver, not elsewhere classified: Secondary | ICD-10-CM

## 2023-05-10 LAB — COMPREHENSIVE METABOLIC PANEL
ALT: 80 IU/L — ABNORMAL HIGH (ref 0–44)
AST: 41 IU/L — ABNORMAL HIGH (ref 0–40)
Albumin/Globulin Ratio: 1.4 (ref 1.2–2.2)
Albumin: 4.2 g/dL (ref 3.9–4.9)
Alkaline Phosphatase: 109 IU/L (ref 44–121)
BUN/Creatinine Ratio: 20 (ref 10–24)
BUN: 20 mg/dL (ref 8–27)
Bilirubin Total: 0.6 mg/dL (ref 0.0–1.2)
CO2: 23 mmol/L (ref 20–29)
Calcium: 10.1 mg/dL (ref 8.6–10.2)
Chloride: 99 mmol/L (ref 96–106)
Creatinine, Ser: 1.01 mg/dL (ref 0.76–1.27)
Globulin, Total: 3 g/dL (ref 1.5–4.5)
Glucose: 132 mg/dL — ABNORMAL HIGH (ref 70–99)
Potassium: 4.5 mmol/L (ref 3.5–5.2)
Sodium: 137 mmol/L (ref 134–144)
Total Protein: 7.2 g/dL (ref 6.0–8.5)
eGFR: 85 mL/min/{1.73_m2} (ref >59)

## 2023-05-10 LAB — CBC
Hematocrit: 55.5 % — ABNORMAL HIGH (ref 37.5–51.0)
Hemoglobin: 19.1 g/dL — ABNORMAL HIGH (ref 13.0–17.7)
MCH: 32.3 pg (ref 26.6–33.0)
MCHC: 34.4 g/dL (ref 31.5–35.7)
MCV: 94 fL (ref 79–97)
Platelets: 252 10*3/uL (ref 150–450)
RBC: 5.92 x10E6/uL — ABNORMAL HIGH (ref 4.14–5.80)
RDW: 12.5 % (ref 11.6–15.4)
WBC: 8 10*3/uL (ref 3.4–10.8)

## 2023-05-10 LAB — MAGNESIUM: Magnesium: 2 mg/dL (ref 1.6–2.3)

## 2023-05-15 ENCOUNTER — Ambulatory Visit: Payer: Federal, State, Local not specified - PPO | Attending: Physician Assistant

## 2023-05-24 ENCOUNTER — Ambulatory Visit: Payer: Federal, State, Local not specified - PPO | Admitting: Nurse Practitioner

## 2023-06-01 ENCOUNTER — Inpatient Hospital Stay: Payer: Federal, State, Local not specified - PPO

## 2023-06-01 ENCOUNTER — Inpatient Hospital Stay: Payer: Federal, State, Local not specified - PPO | Attending: Hematology | Admitting: Hematology

## 2023-06-01 ENCOUNTER — Encounter: Payer: Self-pay | Admitting: Hematology

## 2023-06-01 VITALS — BP 115/83 | HR 83 | Temp 98.7°F | Resp 16 | Ht 72.0 in | Wt 309.1 lb

## 2023-06-01 DIAGNOSIS — Z79890 Hormone replacement therapy (postmenopausal): Secondary | ICD-10-CM | POA: Diagnosis not present

## 2023-06-01 DIAGNOSIS — D751 Secondary polycythemia: Secondary | ICD-10-CM | POA: Diagnosis not present

## 2023-06-01 DIAGNOSIS — F17210 Nicotine dependence, cigarettes, uncomplicated: Secondary | ICD-10-CM | POA: Insufficient documentation

## 2023-06-01 DIAGNOSIS — G4733 Obstructive sleep apnea (adult) (pediatric): Secondary | ICD-10-CM | POA: Diagnosis not present

## 2023-06-01 DIAGNOSIS — D582 Other hemoglobinopathies: Secondary | ICD-10-CM

## 2023-06-01 DIAGNOSIS — F17290 Nicotine dependence, other tobacco product, uncomplicated: Secondary | ICD-10-CM | POA: Diagnosis not present

## 2023-06-01 LAB — URINALYSIS, ROUTINE W REFLEX MICROSCOPIC
Bilirubin Urine: NEGATIVE
Glucose, UA: NEGATIVE mg/dL
Hgb urine dipstick: NEGATIVE
Ketones, ur: NEGATIVE mg/dL
Leukocytes,Ua: NEGATIVE
Nitrite: NEGATIVE
Protein, ur: NEGATIVE mg/dL
Specific Gravity, Urine: 1.02 (ref 1.005–1.030)
pH: 6 (ref 5.0–8.0)

## 2023-06-01 LAB — CBC WITH DIFFERENTIAL/PLATELET
Abs Immature Granulocytes: 0.02 10*3/uL (ref 0.00–0.07)
Basophils Absolute: 0.1 10*3/uL (ref 0.0–0.1)
Basophils Relative: 1 %
Eosinophils Absolute: 0.3 10*3/uL (ref 0.0–0.5)
Eosinophils Relative: 5 %
HCT: 51.3 % (ref 39.0–52.0)
Hemoglobin: 17.3 g/dL — ABNORMAL HIGH (ref 13.0–17.0)
Immature Granulocytes: 0 %
Lymphocytes Relative: 27 %
Lymphs Abs: 1.8 10*3/uL (ref 0.7–4.0)
MCH: 31.8 pg (ref 26.0–34.0)
MCHC: 33.7 g/dL (ref 30.0–36.0)
MCV: 94.3 fL (ref 80.0–100.0)
Monocytes Absolute: 0.5 10*3/uL (ref 0.1–1.0)
Monocytes Relative: 8 %
Neutro Abs: 3.8 10*3/uL (ref 1.7–7.7)
Neutrophils Relative %: 59 %
Platelets: 193 10*3/uL (ref 150–400)
RBC: 5.44 MIL/uL (ref 4.22–5.81)
RDW: 12 % (ref 11.5–15.5)
WBC: 6.5 10*3/uL (ref 4.0–10.5)
nRBC: 0 % (ref 0.0–0.2)

## 2023-06-01 LAB — FERRITIN: Ferritin: 215 ng/mL (ref 24–336)

## 2023-06-01 LAB — IRON AND TIBC
Iron: 123 ug/dL (ref 45–182)
Saturation Ratios: 37 % (ref 17.9–39.5)
TIBC: 332 ug/dL (ref 250–450)
UIBC: 209 ug/dL

## 2023-06-01 LAB — LACTATE DEHYDROGENASE: LDH: 134 U/L (ref 98–192)

## 2023-06-01 NOTE — Patient Instructions (Addendum)
You were seen and examined today by Dr. Ellin Saba. Dr. Ellin Saba is a hematologist, meaning that he specializes in blood abnormalities. Dr. Ellin Saba discussed your past medical history, family history of cancers/blood conditions and the events that led to you being here today.  You were referred to Dr. Ellin Saba due to elevated hemoglobin.  Dr. Ellin Saba has recommended additional labs today for further evaluation.  We will refer you to an endocrinologist to help manage your diabetes.   Follow-up as scheduled.

## 2023-06-01 NOTE — Progress Notes (Signed)
CONSULT NOTE  Patient Care Team: [name] as PCP - General (Physician Assistant) Pricilla Riffle, MD as PCP - Cardiology (Cardiology) Lanelle Bal, DO as Consulting Physician (Gastroenterology)  CHIEF COMPLAINTS/PURPOSE OF CONSULTATION:  Erythrocytosis  HISTORY OF PRESENTING ILLNESS:  David Hammond 62 y.o. 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  CBC on 05/09/2023 showed hemoglobin 19.1 and hematocrit 55.5.  Review of his labs show elevated hemoglobin since January 2021.  He is on testosterone injections every 2 weeks for several years.  He also has history of sleep apnea and is on CPAP.  He denies any aquagenic pruritus or vasomotor symptoms.  No prior history of thrombosis.  He has lost about 110 pounds / 3 years as he changed his diet and is on Trulicity.  He has donated blood many years ago.  He never received a blood transfusion.    MEDICAL HISTORY:  Past Medical History:  Diagnosis Date   Allergy 2016   Trazodone   Arthritis 1967   Knees   Atrial flutter (HCC)    Cardiomyopathy (HCC)    Chronic HFrEF (heart failure with reduced ejection fraction) (HCC)    Diabetes mellitus (HCC) 2019   GERD (gastroesophageal reflux disease) 2015   Hepatic steatosis    Hypercholesterolemia 04/29/2023   Morbid obesity (HCC)    Sleep apnea 2015   Controlled with Tx CPAP    SURGICAL HISTORY: Past Surgical History:  Procedure Laterality Date   CARDIOVERSION N/A 05/02/2023   Procedure: CARDIOVERSION;  Surgeon: Pricilla Riffle, MD;  Location: [facility];  Service: Cardiovascular;  Laterality: N/A;   HERNIA REPAIR  2012   Umbilical   TEE WITHOUT CARDIOVERSION N/A 05/02/2023   Procedure: TRANSESOPHAGEAL ECHOCARDIOGRAM (TEE);  Surgeon: Pricilla Riffle, MD;  Location: [facility];  Service: Cardiovascular;  Laterality: N/A;   VASECTOMY      SOCIAL HISTORY: Social History   Socioeconomic History   Marital status: Married    Spouse name:  Not on file   Number of children: Not on file   Years of education: Not on file   Highest education level: Not on file  Occupational History   Not on file  Tobacco Use   Smoking status: Light Smoker    Packs/day: 0.00    Years: 40.00    Additional pack years: 0.00    Total pack years: 0.00    Types: Pipe, Cigarettes   Smokeless tobacco: Never   Tobacco comments:    1-2 pipes per day. Used to smoke cigarettes. Quit about 2015. Use nicotine replacement gum/mints.  Vaping Use   Vaping Use: Never used  Substance and Sexual Activity   Alcohol use: Yes   Drug use: Never   Sexual activity: Yes    Partners: Female    Birth control/protection: None    Comment: Vasectomy  Other Topics Concern   Not on file  Social History Narrative   Not on file   Social Determinants of Health   Financial Resource Strain: Low Risk  (06/03/2019)   Overall Financial Resource Strain (CARDIA)    Difficulty of Paying Living Expenses: Not hard at all  Food Insecurity: No Food Insecurity (04/30/2023)   Hunger Vital Sign    Worried About Running Out of Food in the Last Year: Never true    Ran Out of Food in the Last Year: Never true  Transportation Needs: No Transportation Needs (04/30/2023)   PRAPARE - Transportation  Lack of Transportation (Medical): No    Lack of Transportation (Non-Medical): No  Physical Activity: Unknown (06/03/2019)   Exercise Vital Sign    Days of Exercise per Week: 7 days    Minutes of Exercise per Session: Not on file  Stress: No Stress Concern Present (06/03/2019)   [employer] of Occupational Health - Occupational Stress Questionnaire    Feeling of Stress : Only a little  Social Connections: Unknown (06/03/2019)   Social Connection and Isolation Panel [NHANES]    Frequency of Communication with Friends and Family: Three times a week    Frequency of Social Gatherings with Friends and Family: Twice a week    Attends Religious Services: Not on Energy manager or Organizations: Not on file    Attends Banker Meetings: Not on file    Marital Status: Married  Intimate Partner Violence: Not At Risk (04/30/2023)   Humiliation, Afraid, Rape, and Kick questionnaire    Fear of Current or Ex-Partner: No    Emotionally Abused: No    Physically Abused: No    Sexually Abused: No    FAMILY HISTORY: Family History  Problem Relation Age of Onset   Arthritis Father    Cancer Father    COPD Father    Diabetes Father    Heart disease Father    Hyperlipidemia Father    Hypertension Father    Kidney disease Father    Stroke Father    Arthritis Mother    Varicose Veins Mother     ALLERGIES:  is allergic to trazodone and fenofibrate.  MEDICATIONS:  Current Outpatient Medications  Medication Sig Dispense Refill   apixaban (ELIQUIS) 5 MG TABS tablet Take 1 tablet (5 mg total) by mouth 2 (two) times daily. 60 tablet 2   Continuous Glucose Sensor (FREESTYLE LIBRE 3 SENSOR) MISC by Does not apply route.     Dulaglutide (TRULICITY) 1.5 MG/0.5ML SOPN Inject 1.5 mg into the skin once a week. 6 mL 0   folic acid (FOLVITE) 1 MG tablet Take 1 tablet (1 mg total) by mouth daily. 30 tablet 1   Insulin Glargine (BASAGLAR KWIKPEN) 100 UNIT/ML Inject 15 Units into the skin at bedtime.     Insulin Pen Needle (PEN NEEDLES) 31G X 8 MM MISC 1 Units by Does not apply route as directed. 50 each 2   metoprolol succinate (TOPROL XL) 50 MG 24 hr tablet Take 1 tablet (50 mg total) by mouth daily. Take with or immediately following a meal. 90 tablet 3   Needles & Syringes MISC      Needles & Syringes MISC      omega-3 acid ethyl esters (LOVAZA) 1 g capsule Take 2 capsules by mouth 2 (two) times daily.     SYRINGE/NEEDLE, DISP, 1 ML 23G X 1" 1 ML MISC Use to administer testosterone 50 each 2   Syringe/Needle, Disp, 18G X 1" 1 ML MISC Use to draw up testosterone 50 each 2   Testosterone Cypionate 200 MG/ML SOLN Inject 0.5 mLs into the muscle every 7 (seven)  days.     thiamine (VITAMIN B-1) 100 MG tablet Take 1 tablet (100 mg total) by mouth daily. 30 tablet 1   No current facility-administered medications for this visit.    REVIEW OF SYSTEMS:   Constitutional: Denies fevers, chills or abnormal night sweats Eyes: Denies blurriness of vision, double vision or watery eyes Ears, nose, mouth, throat, and face: Denies mucositis or sore  throat Respiratory: Denies cough, dyspnea or wheezes Cardiovascular: Denies palpitation, chest discomfort or lower extremity swelling Gastrointestinal:  Denies nausea, heartburn or change in bowel habits Skin: Denies abnormal skin rashes Lymphatics: Denies new lymphadenopathy or easy bruising Neurological:Denies numbness, tingling or new weaknesses Behavioral/Psych: Mood is stable, no new changes  All other systems were reviewed with the patient and are negative.  PHYSICAL EXAMINATION: ECOG PERFORMANCE STATUS: 1 - Symptomatic but completely ambulatory  Vitals:   06/01/23 1135  BP: 115/83  Pulse: 83  Resp: 16  Temp: 98.7 F (37.1 C)  SpO2: 93%   Filed Weights   06/01/23 1135  Weight: (!) 309 lb 1.6 oz (140.2 kg)    GENERAL:alert, no distress and comfortable SKIN: skin color, texture, turgor are normal, no rashes or significant lesions EYES: normal, conjunctiva are pink and non-injected, sclera clear OROPHARYNX:no exudate, no erythema and lips, buccal mucosa, and tongue normal  NECK: supple, thyroid normal size, non-tender, without nodularity LYMPH:  no palpable lymphadenopathy in the cervical, axillary or inguinal LUNGS: clear to auscultation and percussion with normal breathing effort HEART: regular rate & rhythm and no murmurs and no lower extremity edema ABDOMEN:abdomen soft, non-tender and normal bowel sounds Musculoskeletal:no cyanosis of digits and no clubbing  PSYCH: alert & oriented x 3 with fluent speech NEURO: no focal motor/sensory deficits  LABORATORY DATA:  I have reviewed the data  as listed Recent Results (from the past 2160 hour(s))  Microalbumin / creatinine urine ratio     Status: Abnormal   Collection Time: 04/06/23  8:07 AM  Result Value Ref Range   Creatinine, Urine 165.5 Not Estab. mg/dL   Microalbumin, Urine 191.4 Not Estab. ug/mL   Microalb/Creat Ratio 74 (H) 0 - 29 mg/g creat    Comment:                        Normal:                0 -  29                        Moderately increased: 30 - 300                        Severely increased:       >300   Hepatitis C antibody     Status: None   Collection Time: 04/06/23  8:07 AM  Result Value Ref Range   Hep C Virus Ab Non Reactive Non Reactive    Comment: HCV antibody alone does not differentiate between previously resolved infection and active infection. Equivocal and Reactive HCV antibody results should be followed up with an HCV RNA test to support the diagnosis of active HCV infection.   VITAMIN D 25 Hydroxy (Vit-D Deficiency, Fractures)     Status: Abnormal   Collection Time: 04/06/23  8:07 AM  Result Value Ref Range   Vit D, 25-Hydroxy 11.3 (L) 30.0 - 100.0 ng/mL    Comment: Vitamin D deficiency has been defined by the Institute of Medicine and an Endocrine Society practice guideline as a level of serum 25-OH vitamin D less than 20 ng/mL (1,2). The Endocrine Society went on to further define vitamin D insufficiency as a level between 21 and 29 ng/mL (2). 1. IOM (Institute of Medicine). 2010. Dietary reference    intakes for calcium and D. [place]: The    [employer]. 2. Holick  MF, Binkley [place], Bischoff-Ferrari HA, et al.    Evaluation, treatment, and prevention of vitamin D    deficiency: an Endocrine Society clinical practice    guideline. JCEM. 2011 Jul; 96(7):1911-30.   HIV Antibody (routine testing w rflx)     Status: None   Collection Time: 04/06/23  8:07 AM  Result Value Ref Range   HIV Screen 4th Generation wRfx Non Reactive Non Reactive    Comment: HIV-1/HIV-2  antibodies and HIV-1 p24 antigen were NOT detected. There is no laboratory evidence of HIV infection. HIV Negative   TSH + free T4     Status: None   Collection Time: 04/06/23  8:07 AM  Result Value Ref Range   TSH 1.530 0.450 - 4.500 uIU/mL   Free T4 1.18 0.82 - 1.77 ng/dL  Lipid panel     Status: Abnormal   Collection Time: 04/06/23  8:07 AM  Result Value Ref Range   Cholesterol, Total 211 (H) 100 - 199 mg/dL   Triglycerides 027 (H) 0 - 149 mg/dL   HDL 63 >25 mg/dL   VLDL Cholesterol Cal 70 (H) 5 - 40 mg/dL   LDL Chol Calc (NIH) 78 0 - 99 mg/dL   Chol/HDL Ratio 3.3 0.0 - 5.0 ratio    Comment:                                   T. Chol/HDL Ratio                                             Men  Women                               1/2 Avg.Risk  3.4    3.3                                   Avg.Risk  5.0    4.4                                2X Avg.Risk  9.6    7.1                                3X Avg.Risk 23.4   11.0   Hemoglobin A1c     Status: Abnormal   Collection Time: 04/06/23  8:07 AM  Result Value Ref Range   Hgb A1c MFr Bld 8.4 (H) 4.8 - 5.6 %    Comment:          Prediabetes: 5.7 - 6.4          Diabetes: >6.4          Glycemic control for adults with diabetes: <7.0    Est. average glucose Bld gHb Est-mCnc 194 mg/dL  [id number]     Status: Abnormal   Collection Time: 04/06/23  8:07 AM  Result Value Ref Range   Glucose 230 (H) 70 - 99 mg/dL   BUN 23 8 - 27 mg/dL   Creatinine, Ser 4.25 0.76 - 1.27 mg/dL   eGFR 99 >  59 mL/min/1.73   BUN/Creatinine Ratio 27 (H) 10 - 24   Sodium 136 134 - 144 mmol/L   Potassium 4.4 3.5 - 5.2 mmol/L   Chloride 97 96 - 106 mmol/L   CO2 17 (L) 20 - 29 mmol/L   Calcium 9.3 8.6 - 10.2 mg/dL   Total Protein 7.1 6.0 - 8.5 g/dL   Albumin 4.2 3.9 - 4.9 g/dL   Globulin, Total 2.9 1.5 - 4.5 g/dL   Albumin/Globulin Ratio 1.4 1.2 - 2.2   Bilirubin Total 0.7 0.0 - 1.2 mg/dL   Alkaline Phosphatase 104 44 - 121 IU/L   AST 51 (H) 0 - 40 IU/L    ALT 85 (H) 0 - 44 IU/L  CBC with Differential/Platelet     Status: Abnormal   Collection Time: 04/06/23  8:07 AM  Result Value Ref Range   WBC 5.8 3.4 - 10.8 x10E3/uL   RBC 5.86 (H) 4.14 - 5.80 x10E6/uL   Hemoglobin 18.8 (H) 13.0 - 17.7 g/dL   Hematocrit 16.1 (H) 09.6 - 51.0 %   MCV 94 79 - 97 fL   MCH 32.1 26.6 - 33.0 pg   MCHC 34.1 31.5 - 35.7 g/dL   RDW 04.5 40.9 - 81.1 %   Platelets 225 150 - 450 x10E3/uL   Neutrophils 58 Not Estab. %   Lymphs 30 Not Estab. %   Monocytes 7 Not Estab. %   Eos 4 Not Estab. %   Basos 1 Not Estab. %   Neutrophils Absolute 3.3 1.4 - 7.0 x10E3/uL   Lymphocytes Absolute 1.8 0.7 - 3.1 x10E3/uL   Monocytes Absolute 0.4 0.1 - 0.9 x10E3/uL   EOS (ABSOLUTE) 0.2 0.0 - 0.4 x10E3/uL   Basophils Absolute 0.1 0.0 - 0.2 x10E3/uL   Immature Granulocytes 0 Not Estab. %   Immature Grans (Abs) 0.0 0.0 - 0.1 x10E3/uL  Testosterone,Free and Total     Status: None   Collection Time: 04/06/23  8:12 AM  Result Value Ref Range   Testosterone 753 264 - 916 ng/dL    Comment: Adult male reference interval is based on a population of healthy nonobese males (BMI <30) between 65 and 22 years old. Travison, et.al. JCEM [phone]. PMID: [id number].    Testosterone, Free 11.2 6.6 - 18.1 pg/mL  CBG monitoring, ED     Status: Abnormal   Collection Time: 04/29/23  4:37 PM  Result Value Ref Range   Glucose-Capillary >600 (HH) 70 - 99 mg/dL    Comment: Glucose reference range applies only to samples taken after fasting for at least 8 hours.  Basic metabolic panel     Status: Abnormal   Collection Time: 04/29/23  4:59 PM  Result Value Ref Range   Sodium 124 (L) 135 - 145 mmol/L   Potassium 4.3 3.5 - 5.1 mmol/L   Chloride 91 (L) 98 - 111 mmol/L   CO2 22 22 - 32 mmol/L   Glucose, Bld 692 (HH) 70 - 99 mg/dL    Comment: CRITICAL RESULT CALLED TO, READ BACK BY AND VERIFIED WITH LONG, L AT 1808 ON 04/29/23 BY SMN. Glucose reference range applies only to samples taken after  fasting for at least 8 hours.    BUN 22 8 - 23 mg/dL   Creatinine, Ser 9.52 0.61 - 1.24 mg/dL   Calcium 8.8 (L) 8.9 - 10.3 mg/dL   GFR, Estimated >84 >13 mL/min    Comment: (NOTE) Calculated using the CKD-EPI Creatinine Equation (2021)    Anion gap 11 5 -  15    Comment: Performed at [facility], [address], [place], [place] [address]  Beta-hydroxybutyric acid     Status: None   Collection Time: 04/29/23  4:59 PM  Result Value Ref Range   Beta-Hydroxybutyric Acid 0.15 0.05 - 0.27 mmol/L    Comment: Performed at [facility], [address] [place], [place] [address]  CBC with Differential (PNL)     Status: Abnormal   Collection Time: 04/29/23  4:59 PM  Result Value Ref Range   WBC 6.7 4.0 - 10.5 K/uL   RBC 5.73 4.22 - 5.81 MIL/uL   Hemoglobin 18.2 (H) 13.0 - 17.0 g/dL   HCT 65.7 (H) 84.6 - 96.2 %   MCV 92.7 80.0 - 100.0 fL   MCH 31.8 26.0 - 34.0 pg   MCHC 34.3 30.0 - 36.0 g/dL   RDW 95.2 84.1 - 32.4 %   Platelets 180 150 - 400 K/uL   nRBC 0.0 0.0 - 0.2 %   Neutrophils Relative % 62 %   Neutro Abs 4.1 1.7 - 7.7 K/uL   Lymphocytes Relative 25 %   Lymphs Abs 1.7 0.7 - 4.0 K/uL   Monocytes Relative 9 %   Monocytes Absolute 0.6 0.1 - 1.0 K/uL   Eosinophils Relative 3 %   Eosinophils Absolute 0.2 0.0 - 0.5 K/uL   Basophils Relative 1 %   Basophils Absolute 0.1 0.0 - 0.1 K/uL   Immature Granulocytes 0 %   Abs Immature Granulocytes 0.02 0.00 - 0.07 K/uL    Comment: Performed at [facility], [address] [place], [place] [address]  Lactic acid, plasma     Status: Abnormal   Collection Time: 04/29/23  4:59 PM  Result Value Ref Range   Lactic Acid, Venous 3.0 (HH) 0.5 - 1.9 mmol/L    Comment: CRITICAL RESULT CALLED TO, READ BACK BY AND VERIFIED WITH DELROSSO, T AT 1827 ON 04/29/23 BY SMN. Performed at [facility], [address], [place], [place] [address]   Troponin I (High Sensitivity)     Status: Abnormal   Collection Time: 04/29/23  5:29 PM  Result Value Ref Range    Troponin I (High Sensitivity) 42 (H) <18 ng/L    Comment: (NOTE) Elevated high sensitivity troponin I (hsTnI) values and significant  changes across serial measurements may suggest ACS but many other  chronic and acute conditions are known to elevate hsTnI results.  Refer to the "Links" section for chest pain algorithms and additional  guidance. Performed at [facility], [address] [place], [place] [address]   Blood gas, arterial     Status: Abnormal   Collection Time: 04/29/23  5:50 PM  Result Value Ref Range   pH, Arterial 7.47 (H) 7.35 - 7.45   pCO2 arterial 35 32 - 48 mmHg   pO2, Arterial 81 (L) 83 - 108 mmHg   Bicarbonate 25.5 20.0 - 28.0 mmol/L   Acid-Base Excess 2.1 (H) 0.0 - 2.0 mmol/L   O2 Saturation 97.8 %   Patient temperature 37.0    Collection site RIGHT RADIAL    Drawn by [id number]    Allens test (pass/fail) PASS PASS    Comment: Performed at [facility], [address], [place], [place] [address]  Urinalysis, Routine w reflex microscopic -Urine, Clean Catch     Status: Abnormal   Collection Time: 04/29/23  6:06 PM  Result Value Ref Range   Color, Urine YELLOW YELLOW   APPearance CLEAR CLEAR   Specific Gravity, Urine <  1.005 (L) 1.005 - 1.030   pH 6.0 5.0 - 8.0   Glucose, UA >=500 (A) NEGATIVE mg/dL   Hgb urine dipstick NEGATIVE NEGATIVE   Bilirubin Urine NEGATIVE NEGATIVE   Ketones, ur NEGATIVE NEGATIVE mg/dL   Protein, ur NEGATIVE NEGATIVE mg/dL   Nitrite NEGATIVE NEGATIVE   Leukocytes,Ua NEGATIVE NEGATIVE    Comment: Performed at [facility], [address], [place], [place] [address]  Urinalysis, Microscopic (reflex)     Status: None   Collection Time: 04/29/23  6:06 PM  Result Value Ref Range   RBC / HPF NONE SEEN 0 - 5 RBC/hpf   WBC, UA 0-5 0 - 5 WBC/hpf   Bacteria, UA NONE SEEN NONE SEEN   Squamous Epithelial / HPF NONE SEEN 0 - 5 /HPF    Comment: Performed at [facility], [address], [place], [place] [address]  CBG monitoring, ED     Status:  Abnormal   Collection Time: 04/29/23  6:27 PM  Result Value Ref Range   Glucose-Capillary 497 (H) 70 - 99 mg/dL    Comment: Glucose reference range applies only to samples taken after fasting for at least 8 hours.  CBG monitoring, ED     Status: Abnormal   Collection Time: 04/29/23  7:22 PM  Result Value Ref Range   Glucose-Capillary 442 (H) 70 - 99 mg/dL    Comment: Glucose reference range applies only to samples taken after fasting for at least 8 hours.  CBG monitoring, ED     Status: Abnormal   Collection Time: 04/29/23  8:15 PM  Result Value Ref Range   Glucose-Capillary 223 (H) 70 - 99 mg/dL    Comment: Glucose reference range applies only to samples taken after fasting for at least 8 hours.  Basic metabolic panel     Status: Abnormal   Collection Time: 04/29/23  8:28 PM  Result Value Ref Range   Sodium 132 (L) 135 - 145 mmol/L    Comment: DELTA CHECK NOTED   Potassium 3.5 3.5 - 5.1 mmol/L   Chloride 98 98 - 111 mmol/L   CO2 24 22 - 32 mmol/L   Glucose, Bld 240 (H) 70 - 99 mg/dL    Comment: Glucose reference range applies only to samples taken after fasting for at least 8 hours.   BUN 19 8 - 23 mg/dL   Creatinine, Ser 4.40 0.61 - 1.24 mg/dL   Calcium 8.8 (L) 8.9 - 10.3 mg/dL   GFR, Estimated >34 >74 mL/min    Comment: (NOTE) Calculated using the CKD-EPI Creatinine Equation (2021)    Anion gap 10 5 - 15    Comment: Performed at [facility], [address] [place], [place] [address]  Lactic acid, plasma     Status: Abnormal   Collection Time: 04/29/23  8:28 PM  Result Value Ref Range   Lactic Acid, Venous 2.6 (HH) 0.5 - 1.9 mmol/L    Comment: CRITICAL VALUE NOTED. VALUE IS CONSISTENT WITH PREVIOUSLY CRITICAL REPORTED/CALLED VALUE Performed at [facility], [address], [place], [place] [address]   Glucose, capillary     Status: Abnormal   Collection Time: 04/29/23  9:15 PM  Result Value Ref Range   Glucose-Capillary 262 (H) 70 - 99 mg/dL    Comment: Glucose  reference range applies only to samples taken after fasting for at least 8 hours.  MRSA Next Gen by PCR, Nasal     Status: None   Collection Time: 04/29/23 10:24 PM   Specimen:  Nasal Mucosa; Nasal Swab  Result Value Ref Range   MRSA by PCR Next Gen NOT DETECTED NOT DETECTED    Comment: (NOTE) The GeneXpert MRSA Assay (FDA approved for NASAL specimens only), is one component of a comprehensive MRSA colonization surveillance program. It is not intended to diagnose MRSA infection nor to guide or monitor treatment for MRSA infections. Test performance is not FDA approved in patients less than 16 years old. Performed at [facility], [address] [place], [place] [address]   Glucose, capillary     Status: Abnormal   Collection Time: 04/29/23 10:32 PM  Result Value Ref Range   Glucose-Capillary 227 (H) 70 - 99 mg/dL    Comment: Glucose reference range applies only to samples taken after fasting for at least 8 hours.  Glucose, capillary     Status: Abnormal   Collection Time: 04/29/23 11:37 PM  Result Value Ref Range   Glucose-Capillary 152 (H) 70 - 99 mg/dL    Comment: Glucose reference range applies only to samples taken after fasting for at least 8 hours.   Comment 1 Notify RN    Comment 2 Document in Chart   Glucose, capillary     Status: Abnormal   Collection Time: 04/30/23 12:31 AM  Result Value Ref Range   Glucose-Capillary 174 (H) 70 - 99 mg/dL    Comment: Glucose reference range applies only to samples taken after fasting for at least 8 hours.  Basic metabolic panel     Status: Abnormal   Collection Time: 04/30/23  1:18 AM  Result Value Ref Range   Sodium 132 (L) 135 - 145 mmol/L   Potassium 3.1 (L) 3.5 - 5.1 mmol/L   Chloride 96 (L) 98 - 111 mmol/L   CO2 25 22 - 32 mmol/L   Glucose, Bld 198 (H) 70 - 99 mg/dL    Comment: Glucose reference range applies only to samples taken after fasting for at least 8 hours.   BUN 17 8 - 23 mg/dL   Creatinine, Ser 6.04 0.61 - 1.24 mg/dL    Calcium 8.1 (L) 8.9 - 10.3 mg/dL   GFR, Estimated >54 >09 mL/min    Comment: (NOTE) Calculated using the CKD-EPI Creatinine Equation (2021)    Anion gap 11 5 - 15    Comment: Performed at [facility], [address], [place], [place] [address]  Beta-hydroxybutyric acid     Status: None   Collection Time: 04/30/23  1:18 AM  Result Value Ref Range   Beta-Hydroxybutyric Acid 0.24 0.05 - 0.27 mmol/L    Comment: Performed at [facility], [address], [place], [place] [address]  Glucose, capillary     Status: Abnormal   Collection Time: 04/30/23  1:18 AM  Result Value Ref Range   Glucose-Capillary 214 (H) 70 - 99 mg/dL    Comment: Glucose reference range applies only to samples taken after fasting for at least 8 hours.   Comment 1 Notify RN    Comment 2 Document in Chart   Glucose, capillary     Status: Abnormal   Collection Time: 04/30/23  2:32 AM  Result Value Ref Range   Glucose-Capillary 193 (H) 70 - 99 mg/dL    Comment: Glucose reference range applies only to samples taken after fasting for at least 8 hours.  Glucose, capillary     Status: Abnormal   Collection Time: 04/30/23  3:34 AM  Result Value Ref Range   Glucose-Capillary 177 (H) 70 - 99 mg/dL  Comment: Glucose reference range applies only to samples taken after fasting for at least 8 hours.   Comment 1 Notify RN    Comment 2 Document in Chart   Glucose, capillary     Status: Abnormal   Collection Time: 04/30/23  4:12 AM  Result Value Ref Range   Glucose-Capillary 178 (H) 70 - 99 mg/dL    Comment: Glucose reference range applies only to samples taken after fasting for at least 8 hours.  Basic metabolic panel     Status: Abnormal   Collection Time: 04/30/23  4:53 AM  Result Value Ref Range   Sodium 132 (L) 135 - 145 mmol/L   Potassium 3.1 (L) 3.5 - 5.1 mmol/L   Chloride 98 98 - 111 mmol/L   CO2 26 22 - 32 mmol/L   Glucose, Bld 157 (H) 70 - 99 mg/dL    Comment: Glucose reference range applies only to samples  taken after fasting for at least 8 hours.   BUN 15 8 - 23 mg/dL   Creatinine, Ser 1.61 0.61 - 1.24 mg/dL   Calcium 8.1 (L) 8.9 - 10.3 mg/dL   GFR, Estimated >09 >60 mL/min    Comment: (NOTE) Calculated using the CKD-EPI Creatinine Equation (2021)    Anion gap 8 5 - 15    Comment: Performed at [facility], [address] [place], [place] [address]  CBC     Status: Abnormal   Collection Time: 04/30/23  4:53 AM  Result Value Ref Range   WBC 7.2 4.0 - 10.5 K/uL   RBC 5.34 4.22 - 5.81 MIL/uL   Hemoglobin 17.1 (H) 13.0 - 17.0 g/dL   HCT 81.1 91.4 - 78.2 %   MCV 93.6 80.0 - 100.0 fL   MCH 32.0 26.0 - 34.0 pg   MCHC 34.2 30.0 - 36.0 g/dL   RDW 95.6 21.3 - 08.6 %   Platelets 168 150 - 400 K/uL   nRBC 0.0 0.0 - 0.2 %    Comment: Performed at [facility], [address] [place], [place] [address]  Magnesium     Status: None   Collection Time: 04/30/23  4:53 AM  Result Value Ref Range   Magnesium 1.7 1.7 - 2.4 mg/dL    Comment: Performed at [facility], [address] [place], [place] [address]  Phosphorus     Status: None   Collection Time: 04/30/23  4:53 AM  Result Value Ref Range   Phosphorus 3.7 2.5 - 4.6 mg/dL    Comment: Performed at [facility], [address], [place], [place] [address]  Glucose, capillary     Status: Abnormal   Collection Time: 04/30/23  5:28 AM  Result Value Ref Range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

## 2023-06-02 LAB — ERYTHROPOIETIN: Erythropoietin: 9 m[IU]/mL (ref 2.6–18.5)

## 2023-06-07 DIAGNOSIS — G4733 Obstructive sleep apnea (adult) (pediatric): Secondary | ICD-10-CM | POA: Diagnosis not present

## 2023-06-08 ENCOUNTER — Ambulatory Visit: Payer: Federal, State, Local not specified - PPO | Attending: Cardiovascular Disease | Admitting: Cardiovascular Disease

## 2023-06-08 ENCOUNTER — Encounter: Payer: Self-pay | Admitting: Cardiovascular Disease

## 2023-06-08 VITALS — BP 120/70 | HR 84 | Ht 72.0 in | Wt 311.6 lb

## 2023-06-08 DIAGNOSIS — I4892 Unspecified atrial flutter: Secondary | ICD-10-CM | POA: Diagnosis not present

## 2023-06-08 MED ORDER — METOPROLOL SUCCINATE ER 25 MG PO TB24: 25.0000 mg | ORAL_TABLET | Freq: Every day | ORAL | 3 refills | Status: AC

## 2023-06-08 NOTE — Progress Notes (Signed)
Electrophysiology Office Note:    Date:  06/08/2023   ID:  David Hammond, DOB 01/21/1961, MRN [id number]  PCP:  David Hammond   [facility] Providers Cardiologist:  Dietrich Pates, MD     Referring MD: David Montana, PA-C   History of Present Illness:    David Hammond is a 62 y.o. male with a medical history significant for atrial fibrillation and flutter hypertension, dyslipidemia, type 2 diabetes, morbid obesity, sleep apnea on CPAP, severe hepatic steatosis referred for arrhythmia management.     He was diagnosed with new onset atrial flutter.  At that time, he had been out of his diabetes medication, had severely elevated blood glucose with symptoms of dry mouth and dizziness.  Echocardiogram showed decreased EF, 35 to 40%.  He underwent DC cardioversion on May 22.  He was discharged on Eliquis and metoprolol.     Since discharge, he has been doing well from a rhythm standpoint.  He has an Scientist, physiological and has been checking his heart rates and rhythm frequently.  He has not had any notifications of elevated rates or atrial fibrillation.  He reports that his shortness of breath and decreased energy's have essentially normalized.  He is very eager to discontinue medications and has not been tolerating the metoprolol well due to fatigue.   EKGs/Labs/Other Studies Reviewed Today:    Echocardiogram:  TTE 04/30/2023 EF 35-40%, poor windows   Monitors:   Stress testing:   Advanced imaging:   Cardiac catherization   EKG:   EKG Interpretation Date/Time:  Friday June 08 2023 16:13:40 EDT Ventricular Rate:  84 PR Interval:  158 QRS Duration:  90 QT Interval:  340 QTC Calculation: 401 R Axis:   62  Text Interpretation: Normal sinus rhythm Normal ECG When compared with ECG of 02-May-2023 14:14, Nonspecific T wave abnormality no longer evident in Lateral leads Confirmed by York Pellant [phone]) on 06/08/2023 4:35:55 PM     Physical Exam:    VS:  BP  120/70   Pulse 84   Ht 6' (1.829 m)   Wt (!) 311 lb 9.6 oz (141.3 kg)   SpO2 97%   BMI 42.26 kg/m     Wt Readings from Last 3 Encounters:  06/08/23 (!) 311 lb 9.6 oz (141.3 kg)  06/01/23 (!) 309 lb 1.6 oz (140.2 kg)  05/09/23 (!) 307 lb (139.3 kg)     GEN:  Well nourished, well developed in no acute distress CARDIAC: RRR, no murmurs, rubs, gallops RESPIRATORY:  Normal work of breathing MUSCULOSKELETAL: no edema    ASSESSMENT & PLAN:    Atrial flutter ECG from May with typical atrial flutter 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 After he recovers from ablation, it would be reasonable to discontinue eliquis with ongoing monitoring with the apple watch.  We discussed the indication, rationale, logistics, anticipated benefits, and potential risks of the ablation procedure including but not limited to -- bleed at the groin access site, chest pain, damage to nearby organs such as the diaphragm, lungs, or esophagus, need for a drainage tube, or prolonged hospitalization. I explained that the risk for stroke, heart attack, need for open chest surgery, or even death is very low but not zero. he  expressed understanding and wishes to proceed.  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

## 2023-06-08 NOTE — Patient Instructions (Addendum)
Medication Instructions:  DECREASE Metoprolol succinate to 25 mg once daily. - - patient made aware *If you need a refill on your cardiac medications before your next appointment, please call your pharmacy*   Testing/Procedures: Atrial Flutter Ablation - we will contact you to set this up (in about a month or so) Your physician has recommended that you have an ablation. Catheter ablation is a medical procedure used to treat some cardiac arrhythmias (irregular heartbeats). During catheter ablation, a long, thin, flexible tube is put into a blood vessel in your groin (upper thigh), or neck. This tube is called an ablation catheter. It is then guided to your heart through the blood vessel. Radio frequency waves destroy small areas of heart tissue where abnormal heartbeats may cause an arrhythmia to start. Please see the instruction sheet given to you today.   Follow-Up: At [facility], you and your health needs are our priority.  As part of our continuing mission to provide you with exceptional heart care, we have created designated Provider Care Teams.  These Care Teams include your primary Cardiologist (physician) and Advanced Practice Providers (APPs -  Physician Assistants and Nurse Practitioners) who all work together to provide you with the care you need, when you need it.  We recommend signing up for the patient portal called "MyChart".  Sign up information is provided on this After Visit Summary.  MyChart is used to connect with patients for Virtual Visits (Telemedicine).  Patients are able to view lab/test results, encounter notes, upcoming appointments, etc.  Non-urgent messages can be sent to your provider as well.   To learn more about what you can do with MyChart, go to [email or web address].    Your next appointment:   Will need to schedule an appointment about 3 weeks prior to ablation   Provider:   York Pellant, MD

## 2023-06-13 LAB — CALR +MPL + E12-E15  (REFLEX)

## 2023-06-13 LAB — JAK2 V617F RFX CALR/MPL/E12-15

## 2023-06-19 ENCOUNTER — Telehealth: Payer: Self-pay | Admitting: Internal Medicine

## 2023-06-19 NOTE — Telephone Encounter (Signed)
Patient called in today with symptomatic AF/AFL with associated flushing sensation. He has just finished taking his medications for the night and is otherwise doing well. I recommended taking his blood pressure and if the systolic is < 90 mmHg to take an additional dose of metoprolol tartrate 25 mg tonight (patient's reports having some leftover from previous prescription). If his symptoms/tachycardia remain in the morning, the patient is to call back for further guidance. Anticipatory guidance and ED precautions were given.

## 2023-06-20 ENCOUNTER — Encounter: Payer: Self-pay | Admitting: Cardiovascular Disease

## 2023-06-20 ENCOUNTER — Telehealth: Payer: Self-pay | Admitting: Cardiovascular Disease

## 2023-06-20 NOTE — Telephone Encounter (Signed)
Pt daughter calling back again 

## 2023-06-20 NOTE — Telephone Encounter (Signed)
Pt wife calling back to speak to nurse or physician about pt's symptoms.

## 2023-06-20 NOTE — Telephone Encounter (Signed)
Patient's wife is calling to get update and requesting someone call as soon as possible.

## 2023-06-20 NOTE — Telephone Encounter (Signed)
Spoke with patient, back in atrial flutter according to apple watch. Noticed the increased heart rate last night, along with lightheadedness and slight shortness of breath. Patent states heart rate was in the 160's last night and was instructed by our on call physician to take an additional dose of metoprolol tartrate (left over prescription) which did help at that time. While on phone with patient currently, heart rate 95-105, denies any symptoms at this time. Patient was wanting to check with Dr Nelly Laurence about having a repeat cardioversion since ablation isn't scheduled until January, will forward to provider for further instruction.   At office visit on 06/08/23 decreased his metoprolol succinate to 25mg  once daily but at that time was in sinus rhythm and had been since last cardioversion on 05/02/23 that the patient is aware of  Ralene Ok, MD13 hours ago (11:02 PM)   Patient called in today with symptomatic AF/AFL with associated flushing sensation. He has just finished taking his medications for the night and is otherwise doing well. I recommended taking his blood pressure and if the systolic is < 90 mmHg to take an additional dose of metoprolol tartrate 25 mg tonight (patient's reports having some leftover from previous prescription). If his symptoms/tachycardia remain in the morning, the patient is to call back for further guidance. Anticipatory guidance and ED precautions were given.

## 2023-06-20 NOTE — Telephone Encounter (Signed)
Follow Up:    Wife is callinhg back. She says she have been waiting to hear something.

## 2023-06-21 NOTE — Telephone Encounter (Signed)
Spoke with patient, back in normal sinus rhythm this morning per apple watch. Patient states he awoke feeling "100 times better" and heart rate back down into the 70's. Patient understands to contact us if back into atrial flutter again for possible cardioversion but would like to hold off on starting amiodarone at this time to see how long he stays in normal sinus rhythm. Educated on the amiodarone only being a bridge to his ablation but patient states he would still like to wait on that option for now. No further concerns at this time.   Mealor, Roberts Gaudy, MD  Festus Holts, RN Caller: Unspecified (Yesterday, 11:51 AM) Yes -- he should definitely get a TEE/CV. We probably also need to put him on an antiarrhythmic until his ablation. Amiodarone will probably be the drug of choice. 200 BID for 2 weeks, then 200 daily. Don't delay DCCV though -- he has had CHF in the past.

## 2023-06-22 ENCOUNTER — Ambulatory Visit (HOSPITAL_COMMUNITY)
Admission: RE | Admit: 2023-06-22 | Discharge: 2023-06-22 | Disposition: A | Discharge status: Home / Self Care | Payer: Federal, State, Local not specified - PPO | Source: Ambulatory Visit | Attending: Physician Assistant | Admitting: Physician Assistant

## 2023-06-22 DIAGNOSIS — I5022 Chronic systolic (congestive) heart failure: Secondary | ICD-10-CM | POA: Insufficient documentation

## 2023-06-22 LAB — ECHOCARDIOGRAM COMPLETE
AR max vel: 1.75 cm2
AV Area VTI: 1.92 cm2
AV Area mean vel: 1.64 cm2
AV Mean grad: 4 mmHg
AV Peak grad: 7.7 mmHg
Ao pk vel: 1.39 m/s
Area-P 1/2: 3.48 cm2
Calc EF: 49.6 %
Est EF: 50
S' Lateral: 3.7 cm
Single Plane A2C EF: 52.2 %
Single Plane A4C EF: 49.6 %

## 2023-06-22 NOTE — Progress Notes (Signed)
*  PRELIMINARY RESULTS* Echocardiogram 2D Echocardiogram has been performed.  Stacey Drain 06/22/2023, 12:33 PM

## 2023-06-27 ENCOUNTER — Inpatient Hospital Stay: Payer: Federal, State, Local not specified - PPO | Attending: Hematology | Admitting: Oncology

## 2023-06-27 DIAGNOSIS — I4892 Unspecified atrial flutter: Secondary | ICD-10-CM | POA: Diagnosis not present

## 2023-06-27 DIAGNOSIS — G4730 Sleep apnea, unspecified: Secondary | ICD-10-CM | POA: Diagnosis not present

## 2023-06-27 DIAGNOSIS — E1142 Type 2 diabetes mellitus with diabetic polyneuropathy: Secondary | ICD-10-CM | POA: Diagnosis not present

## 2023-06-27 NOTE — Progress Notes (Deleted)
CONSULT NOTE  Patient Care Team: [name] as PCP - General (Physician Assistant) Pricilla Riffle, MD as PCP - Cardiology (Cardiology) Lanelle Bal, DO as Consulting Physician (Gastroenterology)  CHIEF COMPLAINTS/PURPOSE OF CONSULTATION:  Erythrocytosis  HISTORY OF PRESENTING ILLNESS:  David Hammond 62 y.o. 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  CBC on 05/09/2023 showed hemoglobin 19.1 and hematocrit 55.5.  Review of his labs show elevated hemoglobin since January 2021.  He is on testosterone injections every 2 weeks for several years.  He also has history of sleep apnea and is on CPAP.  He denies any aquagenic pruritus or vasomotor symptoms.  No prior history of thrombosis.  He has lost about 110 pounds / 3 years as he changed his diet and is on Trulicity.  He has donated blood many years ago.  He never received a blood transfusion.    MEDICAL HISTORY:  Past Medical History:  Diagnosis Date   Allergy 2016   Trazodone   Arthritis 1967   Knees   Atrial flutter (HCC)    Cardiomyopathy (HCC)    Chronic HFrEF (heart failure with reduced ejection fraction) (HCC)    Diabetes mellitus (HCC) 2019   GERD (gastroesophageal reflux disease) 2015   Hepatic steatosis    Hypercholesterolemia 04/29/2023   Morbid obesity (HCC)    Sleep apnea 2015   Controlled with Tx CPAP    SURGICAL HISTORY: Past Surgical History:  Procedure Laterality Date   CARDIOVERSION N/A 05/02/2023   Procedure: CARDIOVERSION;  Surgeon: Pricilla Riffle, MD;  Location: [facility];  Service: Cardiovascular;  Laterality: N/A;   HERNIA REPAIR  2012   Umbilical   TEE WITHOUT CARDIOVERSION N/A 05/02/2023   Procedure: TRANSESOPHAGEAL ECHOCARDIOGRAM (TEE);  Surgeon: Pricilla Riffle, MD;  Location: [facility];  Service: Cardiovascular;  Laterality: N/A;   VASECTOMY      SOCIAL HISTORY: Social History   Socioeconomic History   Marital status: Married    Spouse name:  Not on file   Number of children: Not on file   Years of education: Not on file   Highest education level: Not on file  Occupational History   Not on file  Tobacco Use   Smoking status: Light Smoker    Current packs/day: 0.00    Types: Pipe, Cigarettes   Smokeless tobacco: Never   Tobacco comments:    1-2 pipes per day. Used to smoke cigarettes. Quit about 2015. Use nicotine replacement gum/mints.  Vaping Use   Vaping status: Never Used  Substance and Sexual Activity   Alcohol use: Yes   Drug use: Never   Sexual activity: Yes    Partners: Female    Birth control/protection: None    Comment: Vasectomy  Other Topics Concern   Not on file  Social History Narrative   Not on file   Social Determinants of Health   Financial Resource Strain: Low Risk  (05/03/2023)   Received from [facility], [facility]   Overall Financial Resource Strain (CARDIA)    Difficulty of Paying Living Expenses: Not hard at all  Food Insecurity: No Food Insecurity (05/03/2023)   Received from [facility], [facility]   Hunger Vital Sign    Worried About Running Out of Food in the Last Year: Never true    Ran Out of Food in the Last Year: Never true  Transportation Needs: No Transportation Needs (05/03/2023)   Received from [facility], [facility]  PRAPARE - Administrator, Civil Service (Medical): No    Lack of Transportation (Non-Medical): No  Physical Activity: Unknown (06/03/2019)   Exercise Vital Sign    Days of Exercise per Week: 7 days    Minutes of Exercise per Session: Not on file  Stress: No Stress Concern Present (06/03/2019)   [employer] of Occupational Health - Occupational Stress Questionnaire    Feeling of Stress : Only a little  Social Connections: Unknown (04/09/2023)   Received from [facility], [facility]   Social Network    Social Network: Not on file  Intimate Partner Violence: Not At Risk (04/30/2023)   Humiliation, Afraid, Rape, and Kick  questionnaire    Fear of Current or Ex-Partner: No    Emotionally Abused: No    Physically Abused: No    Sexually Abused: No    FAMILY HISTORY: Family History  Problem Relation Age of Onset   Arthritis Father    Cancer Father    COPD Father    Diabetes Father    Heart disease Father    Hyperlipidemia Father    Hypertension Father    Kidney disease Father    Stroke Father    Arthritis Mother    Varicose Veins Mother     ALLERGIES:  is allergic to trazodone and fenofibrate.  MEDICATIONS:  Current Outpatient Medications  Medication Sig Dispense Refill   apixaban (ELIQUIS) 5 MG TABS tablet Take 1 tablet (5 mg total) by mouth 2 (two) times daily. 60 tablet 2   Continuous Glucose Sensor (FREESTYLE LIBRE 3 SENSOR) MISC by Does not apply route.     Dulaglutide (TRULICITY) 1.5 MG/0.5ML SOPN Inject 1.5 mg into the skin once a week. 6 mL 0   folic acid (FOLVITE) 1 MG tablet Take 1 tablet (1 mg total) by mouth daily. 30 tablet 1   Insulin Glargine (BASAGLAR KWIKPEN) 100 UNIT/ML Inject 15 Units into the skin at bedtime.     Insulin Pen Needle (PEN NEEDLES) 31G X 8 MM MISC 1 Units by Does not apply route as directed. 50 each 2   metoprolol succinate (TOPROL XL) 25 MG 24 hr tablet Take 1 tablet (25 mg total) by mouth daily. 90 tablet 3   Needles & Syringes MISC      Needles & Syringes MISC      omega-3 acid ethyl esters (LOVAZA) 1 g capsule Take 2 capsules by mouth 2 (two) times daily.     SYRINGE/NEEDLE, DISP, 1 ML 23G X 1" 1 ML MISC Use to administer testosterone 50 each 2   Syringe/Needle, Disp, 18G X 1" 1 ML MISC Use to draw up testosterone 50 each 2   Testosterone Cypionate 200 MG/ML SOLN Inject 0.5 mLs into the muscle every 7 (seven) days.     thiamine (VITAMIN B-1) 100 MG tablet Take 1 tablet (100 mg total) by mouth daily. 30 tablet 1   No current facility-administered medications for this visit.    REVIEW OF SYSTEMS:   Constitutional: Denies fevers, chills or abnormal night  sweats Eyes: Denies blurriness of vision, double vision or watery eyes Ears, nose, mouth, throat, and face: Denies mucositis or sore throat Respiratory: Denies cough, dyspnea or wheezes Cardiovascular: Denies palpitation, chest discomfort or lower extremity swelling Gastrointestinal:  Denies nausea, heartburn or change in bowel habits Skin: Denies abnormal skin rashes Lymphatics: Denies new lymphadenopathy or easy bruising Neurological:Denies numbness, tingling or new weaknesses Behavioral/Psych: Mood is stable, no new changes  All other systems  were reviewed with the patient and are negative.  PHYSICAL EXAMINATION: ECOG PERFORMANCE STATUS: 1 - Symptomatic but completely ambulatory  There were no vitals filed for this visit.  There were no vitals filed for this visit.   GENERAL:alert, no distress and comfortable SKIN: skin color, texture, turgor are normal, no rashes or significant lesions EYES: normal, conjunctiva are pink and non-injected, sclera clear OROPHARYNX:no exudate, no erythema and lips, buccal mucosa, and tongue normal  NECK: supple, thyroid normal size, non-tender, without nodularity LYMPH:  no palpable lymphadenopathy in the cervical, axillary or inguinal LUNGS: clear to auscultation and percussion with normal breathing effort HEART: regular rate & rhythm and no murmurs and no lower extremity edema ABDOMEN:abdomen soft, non-tender and normal bowel sounds Musculoskeletal:no cyanosis of digits and no clubbing  PSYCH: alert & oriented x 3 with fluent speech NEURO: no focal motor/sensory deficits  LABORATORY DATA:  I have reviewed the data as listed Recent Results (from the past 2160 hour(s))  Microalbumin / creatinine urine ratio     Status: Abnormal   Collection Time: 04/06/23  8:07 AM  Result Value Ref Range   Creatinine, Urine 165.5 Not Estab. mg/dL   Microalbumin, Urine 098.1 Not Estab. ug/mL   Microalb/Creat Ratio 74 (H) 0 - 29 mg/g creat    Comment:                         Normal:                0 -  29                        Moderately increased: 30 - 300                        Severely increased:       >300   Hepatitis C antibody     Status: None   Collection Time: 04/06/23  8:07 AM  Result Value Ref Range   Hep C Virus Ab Non Reactive Non Reactive    Comment: HCV antibody alone does not differentiate between previously resolved infection and active infection. Equivocal and Reactive HCV antibody results should be followed up with an HCV RNA test to support the diagnosis of active HCV infection.   VITAMIN D 25 Hydroxy (Vit-D Deficiency, Fractures)     Status: Abnormal   Collection Time: 04/06/23  8:07 AM  Result Value Ref Range   Vit D, 25-Hydroxy 11.3 (L) 30.0 - 100.0 ng/mL    Comment: Vitamin D deficiency has been defined by the Institute of Medicine and an Endocrine Society practice guideline as a level of serum 25-OH vitamin D less than 20 ng/mL (1,2). The Endocrine Society went on to further define vitamin D insufficiency as a level between 21 and 29 ng/mL (2). 1. IOM (Institute of Medicine). 2010. Dietary reference    intakes for calcium and D. [place]: The    [employer]. 2. Holick MF, Binkley Alva, Bischoff-Ferrari HA, et al.    Evaluation, treatment, and prevention of vitamin D    deficiency: an Endocrine Society clinical practice    guideline. JCEM. 2011 Jul; 96(7):1911-30.   HIV Antibody (routine testing w rflx)     Status: None   Collection Time: 04/06/23  8:07 AM  Result Value Ref Range   HIV Screen 4th Generation wRfx Non Reactive Non Reactive    Comment: HIV-1/HIV-2 antibodies and  HIV-1 p24 antigen were NOT detected. There is no laboratory evidence of HIV infection. HIV Negative   TSH + free T4     Status: None   Collection Time: 04/06/23  8:07 AM  Result Value Ref Range   TSH 1.530 0.450 - 4.500 uIU/mL   Free T4 1.18 0.82 - 1.77 ng/dL  Lipid panel     Status: Abnormal   Collection Time:  04/06/23  8:07 AM  Result Value Ref Range   Cholesterol, Total 211 (H) 100 - 199 mg/dL   Triglycerides 272 (H) 0 - 149 mg/dL   HDL 63 >53 mg/dL   VLDL Cholesterol Cal 70 (H) 5 - 40 mg/dL   LDL Chol Calc (NIH) 78 0 - 99 mg/dL   Chol/HDL Ratio 3.3 0.0 - 5.0 ratio    Comment:                                   T. Chol/HDL Ratio                                             Men  Women                               1/2 Avg.Risk  3.4    3.3                                   Avg.Risk  5.0    4.4                                2X Avg.Risk  9.6    7.1                                3X Avg.Risk 23.4   11.0   Hemoglobin A1c     Status: Abnormal   Collection Time: 04/06/23  8:07 AM  Result Value Ref Range   Hgb A1c MFr Bld 8.4 (H) 4.8 - 5.6 %    Comment:          Prediabetes: 5.7 - 6.4          Diabetes: >6.4          Glycemic control for adults with diabetes: <7.0    Est. average glucose Bld gHb Est-mCnc 194 mg/dL  GUY40+HKVQ     Status: Abnormal   Collection Time: 04/06/23  8:07 AM  Result Value Ref Range   Glucose 230 (H) 70 - 99 mg/dL   BUN 23 8 - 27 mg/dL   Creatinine, Ser 2.59 0.76 - 1.27 mg/dL   eGFR 99 >56 LO/VFI/4.33   BUN/Creatinine Ratio 27 (H) 10 - 24   Sodium 136 134 - 144 mmol/L   Potassium 4.4 3.5 - 5.2 mmol/L   Chloride 97 96 - 106 mmol/L   CO2 17 (L) 20 - 29 mmol/L   Calcium 9.3 8.6 - 10.2 mg/dL   Total Protein 7.1 6.0 - 8.5 g/dL   Albumin 4.2 3.9 - 4.9 g/dL   Globulin, Total 2.9 1.5 - 4.5 g/dL  Albumin/Globulin Ratio 1.4 1.2 - 2.2   Bilirubin Total 0.7 0.0 - 1.2 mg/dL   Alkaline Phosphatase 104 44 - 121 IU/L   AST 51 (H) 0 - 40 IU/L   ALT 85 (H) 0 - 44 IU/L  CBC with Differential/Platelet     Status: Abnormal   Collection Time: 04/06/23  8:07 AM  Result Value Ref Range   WBC 5.8 3.4 - 10.8 x10E3/uL   RBC 5.86 (H) 4.14 - 5.80 x10E6/uL   Hemoglobin 18.8 (H) 13.0 - 17.7 g/dL   Hematocrit 27.2 (H) 53.6 - 51.0 %   MCV 94 79 - 97 fL   MCH 32.1 26.6 - 33.0 pg   MCHC  34.1 31.5 - 35.7 g/dL   RDW 64.4 03.4 - 74.2 %   Platelets 225 150 - 450 x10E3/uL   Neutrophils 58 Not Estab. %   Lymphs 30 Not Estab. %   Monocytes 7 Not Estab. %   Eos 4 Not Estab. %   Basos 1 Not Estab. %   Neutrophils Absolute 3.3 1.4 - 7.0 x10E3/uL   Lymphocytes Absolute 1.8 0.7 - 3.1 x10E3/uL   Monocytes Absolute 0.4 0.1 - 0.9 x10E3/uL   EOS (ABSOLUTE) 0.2 0.0 - 0.4 x10E3/uL   Basophils Absolute 0.1 0.0 - 0.2 x10E3/uL   Immature Granulocytes 0 Not Estab. %   Immature Grans (Abs) 0.0 0.0 - 0.1 x10E3/uL  Testosterone,Free and Total     Status: None   Collection Time: 04/06/23  8:12 AM  Result Value Ref Range   Testosterone 753 264 - 916 ng/dL    Comment: Adult male reference interval is based on a population of healthy nonobese males (BMI <30) between 82 and 58 years old. Travison, et.al. JCEM [phone]. PMID: [id number].    Testosterone, Free 11.2 6.6 - 18.1 pg/mL  CBG monitoring, ED     Status: Abnormal   Collection Time: 04/29/23  4:37 PM  Result Value Ref Range   Glucose-Capillary >600 (HH) 70 - 99 mg/dL    Comment: Glucose reference range applies only to samples taken after fasting for at least 8 hours.  Basic metabolic panel     Status: Abnormal   Collection Time: 04/29/23  4:59 PM  Result Value Ref Range   Sodium 124 (L) 135 - 145 mmol/L   Potassium 4.3 3.5 - 5.1 mmol/L   Chloride 91 (L) 98 - 111 mmol/L   CO2 22 22 - 32 mmol/L   Glucose, Bld 692 (HH) 70 - 99 mg/dL    Comment: CRITICAL RESULT CALLED TO, READ BACK BY AND VERIFIED WITH LONG, L AT 1808 ON 04/29/23 BY SMN. Glucose reference range applies only to samples taken after fasting for at least 8 hours.    BUN 22 8 - 23 mg/dL   Creatinine, Ser 6.30 0.61 - 1.24 mg/dL   Calcium 8.8 (L) 8.9 - 10.3 mg/dL   GFR, Estimated >16 >01 mL/min    Comment: (NOTE) Calculated using the CKD-EPI Creatinine Equation (2021)    Anion gap 11 5 - 15    Comment: Performed at [facility], [address], [place],  [place] [address]  Beta-hydroxybutyric acid     Status: None   Collection Time: 04/29/23  4:59 PM  Result Value Ref Range   Beta-Hydroxybutyric Acid 0.15 0.05 - 0.27 mmol/L    Comment: Performed at [facility], [address], [place], [place] [address]  CBC with Differential (PNL)     Status: Abnormal   Collection Time: 04/29/23  4:59 PM  Result Value Ref Range   WBC 6.7 4.0 - 10.5 K/uL   RBC 5.73 4.22 - 5.81 MIL/uL   Hemoglobin 18.2 (H) 13.0 - 17.0 g/dL   HCT 62.1 (H) 30.8 - 65.7 %   MCV 92.7 80.0 - 100.0 fL   MCH 31.8 26.0 - 34.0 pg   MCHC 34.3 30.0 - 36.0 g/dL   RDW 84.6 96.2 - 95.2 %   Platelets 180 150 - 400 K/uL   nRBC 0.0 0.0 - 0.2 %   Neutrophils Relative % 62 %   Neutro Abs 4.1 1.7 - 7.7 K/uL   Lymphocytes Relative 25 %   Lymphs Abs 1.7 0.7 - 4.0 K/uL   Monocytes Relative 9 %   Monocytes Absolute 0.6 0.1 - 1.0 K/uL   Eosinophils Relative 3 %   Eosinophils Absolute 0.2 0.0 - 0.5 K/uL   Basophils Relative 1 %   Basophils Absolute 0.1 0.0 - 0.1 K/uL   Immature Granulocytes 0 %   Abs Immature Granulocytes 0.02 0.00 - 0.07 K/uL    Comment: Performed at [facility], [address], [place], [place] [address]  Lactic acid, plasma     Status: Abnormal   Collection Time: 04/29/23  4:59 PM  Result Value Ref Range   Lactic Acid, Venous 3.0 (HH) 0.5 - 1.9 mmol/L    Comment: CRITICAL RESULT CALLED TO, READ BACK BY AND VERIFIED WITH DELROSSO, T AT 1827 ON 04/29/23 BY SMN. Performed at [facility], [address], [place], [place] [address]   Troponin I (High Sensitivity)     Status: Abnormal   Collection Time: 04/29/23  5:29 PM  Result Value Ref Range   Troponin I (High Sensitivity) 42 (H) <18 ng/L    Comment: (NOTE) Elevated high sensitivity troponin I (hsTnI) values and significant  changes across serial measurements may suggest ACS but many other  chronic and acute conditions are known to elevate hsTnI results.  Refer to the "Links" section for chest pain algorithms and  additional  guidance. Performed at [facility], [address] [place], [place] [address]   Blood gas, arterial     Status: Abnormal   Collection Time: 04/29/23  5:50 PM  Result Value Ref Range   pH, Arterial 7.47 (H) 7.35 - 7.45   pCO2 arterial 35 32 - 48 mmHg   pO2, Arterial 81 (L) 83 - 108 mmHg   Bicarbonate 25.5 20.0 - 28.0 mmol/L   Acid-Base Excess 2.1 (H) 0.0 - 2.0 mmol/L   O2 Saturation 97.8 %   Patient temperature 37.0    Collection site RIGHT RADIAL    Drawn by [id number]    Allens test (pass/fail) PASS PASS    Comment: Performed at [facility], [address], [place], [place] [address]  Urinalysis, Routine w reflex microscopic -Urine, Clean Catch     Status: Abnormal   Collection Time: 04/29/23  6:06 PM  Result Value Ref Range   Color, Urine YELLOW YELLOW   APPearance CLEAR CLEAR   Specific Gravity, Urine <1.005 (L) 1.005 - 1.030   pH 6.0 5.0 - 8.0   Glucose, UA >=500 (A) NEGATIVE mg/dL   Hgb urine dipstick NEGATIVE NEGATIVE   Bilirubin Urine NEGATIVE NEGATIVE   Ketones, ur NEGATIVE NEGATIVE mg/dL   Protein, ur NEGATIVE NEGATIVE mg/dL   Nitrite NEGATIVE NEGATIVE   Leukocytes,Ua NEGATIVE NEGATIVE    Comment: Performed at [facility], [address] [place], [place] [address]  Urinalysis, Microscopic (reflex)  Status: None   Collection Time: 04/29/23  6:06 PM  Result Value Ref Range   RBC / HPF NONE SEEN 0 - 5 RBC/hpf   WBC, UA 0-5 0 - 5 WBC/hpf   Bacteria, UA NONE SEEN NONE SEEN   Squamous Epithelial / HPF NONE SEEN 0 - 5 /HPF    Comment: Performed at [facility], [address] [place], [place] [address]  CBG monitoring, ED     Status: Abnormal   Collection Time: 04/29/23  6:27 PM  Result Value Ref Range   Glucose-Capillary 497 (H) 70 - 99 mg/dL    Comment: Glucose reference range applies only to samples taken after fasting for at least 8 hours.  CBG monitoring, ED     Status: Abnormal   Collection Time: 04/29/23  7:22 PM  Result Value Ref Range    Glucose-Capillary 442 (H) 70 - 99 mg/dL    Comment: Glucose reference range applies only to samples taken after fasting for at least 8 hours.  CBG monitoring, ED     Status: Abnormal   Collection Time: 04/29/23  8:15 PM  Result Value Ref Range   Glucose-Capillary 223 (H) 70 - 99 mg/dL    Comment: Glucose reference range applies only to samples taken after fasting for at least 8 hours.  Basic metabolic panel     Status: Abnormal   Collection Time: 04/29/23  8:28 PM  Result Value Ref Range   Sodium 132 (L) 135 - 145 mmol/L    Comment: DELTA CHECK NOTED   Potassium 3.5 3.5 - 5.1 mmol/L   Chloride 98 98 - 111 mmol/L   CO2 24 22 - 32 mmol/L   Glucose, Bld 240 (H) 70 - 99 mg/dL    Comment: Glucose reference range applies only to samples taken after fasting for at least 8 hours.   BUN 19 8 - 23 mg/dL   Creatinine, Ser 6.57 0.61 - 1.24 mg/dL   Calcium 8.8 (L) 8.9 - 10.3 mg/dL   GFR, Estimated >84 >69 mL/min    Comment: (NOTE) Calculated using the CKD-EPI Creatinine Equation (2021)    Anion gap 10 5 - 15    Comment: Performed at [facility], [address] [place], [place] [address]  Lactic acid, plasma     Status: Abnormal   Collection Time: 04/29/23  8:28 PM  Result Value Ref Range   Lactic Acid, Venous 2.6 (HH) 0.5 - 1.9 mmol/L    Comment: CRITICAL VALUE NOTED. VALUE IS CONSISTENT WITH PREVIOUSLY CRITICAL REPORTED/CALLED VALUE Performed at [facility], [address] [place], [place] [address]   Glucose, capillary     Status: Abnormal   Collection Time: 04/29/23  9:15 PM  Result Value Ref Range   Glucose-Capillary 262 (H) 70 - 99 mg/dL    Comment: Glucose reference range applies only to samples taken after fasting for at least 8 hours.  MRSA Next Gen by PCR, Nasal     Status: None   Collection Time: 04/29/23 10:24 PM   Specimen: Nasal Mucosa; Nasal Swab  Result Value Ref Range   MRSA by PCR Next Gen NOT DETECTED NOT DETECTED    Comment: (NOTE) The GeneXpert MRSA Assay (FDA  approved for NASAL specimens only), is one component of a comprehensive MRSA colonization surveillance program. It is not intended to diagnose MRSA infection nor to guide or monitor treatment for MRSA infections. Test performance is not FDA approved in patients less than 34 years old. Performed at [facility],  [address], [place], [place] [address]   Glucose, capillary     Status: Abnormal   Collection Time: 04/29/23 10:32 PM  Result Value Ref Range   Glucose-Capillary 227 (H) 70 - 99 mg/dL    Comment: Glucose reference range applies only to samples taken after fasting for at least 8 hours.  Glucose, capillary     Status: Abnormal   Collection Time: 04/29/23 11:37 PM  Result Value Ref Range   Glucose-Capillary 152 (H) 70 - 99 mg/dL    Comment: Glucose reference range applies only to samples taken after fasting for at least 8 hours.   Comment 1 Notify RN    Comment 2 Document in Chart   Glucose, capillary     Status: Abnormal   Collection Time: 04/30/23 12:31 AM  Result Value Ref Range   Glucose-Capillary 174 (H) 70 - 99 mg/dL    Comment: Glucose reference range applies only to samples taken after fasting for at least 8 hours.  Basic metabolic panel     Status: Abnormal   Collection Time: 04/30/23  1:18 AM  Result Value Ref Range   Sodium 132 (L) 135 - 145 mmol/L   Potassium 3.1 (L) 3.5 - 5.1 mmol/L   Chloride 96 (L) 98 - 111 mmol/L   CO2 25 22 - 32 mmol/L   Glucose, Bld 198 (H) 70 - 99 mg/dL    Comment: Glucose reference range applies only to samples taken after fasting for at least 8 hours.   BUN 17 8 - 23 mg/dL   Creatinine, Ser 6.44 0.61 - 1.24 mg/dL   Calcium 8.1 (L) 8.9 - 10.3 mg/dL   GFR, Estimated >03 >47 mL/min    Comment: (NOTE) Calculated using the CKD-EPI Creatinine Equation (2021)    Anion gap 11 5 - 15    Comment: Performed at [facility], [address], [place], [place] [address]  Beta-hydroxybutyric acid     Status: None   Collection Time: 04/30/23  1:18  AM  Result Value Ref Range   Beta-Hydroxybutyric Acid 0.24 0.05 - 0.27 mmol/L    Comment: Performed at [facility], [address] [place], [place] [address]  Glucose, capillary     Status: Abnormal   Collection Time: 04/30/23  1:18 AM  Result Value Ref Range   Glucose-Capillary 214 (H) 70 - 99 mg/dL    Comment: Glucose reference range applies only to samples taken after fasting for at least 8 hours.   Comment 1 Notify RN    Comment 2 Document in Chart   Glucose, capillary     Status: Abnormal   Collection Time: 04/30/23  2:32 AM  Result Value Ref Range   Glucose-Capillary 193 (H) 70 - 99 mg/dL    Comment: Glucose reference range applies only to samples taken after fasting for at least 8 hours.  Glucose, capillary     Status: Abnormal   Collection Time: 04/30/23  3:34 AM  Result Value Ref Range   Glucose-Capillary 177 (H) 70 - 99 mg/dL    Comment: Glucose reference range applies only to samples taken after fasting for at least 8 hours.   Comment 1 Notify RN    Comment 2 Document in Chart   Glucose, capillary     Status: Abnormal   Collection Time: 04/30/23  4:12 AM  Result Value Ref Range   Glucose-Capillary 178 (H) 70 - 99 mg/dL    Comment: Glucose reference range applies only to samples taken after fasting for at least  8 hours.  Basic metabolic panel     Status: Abnormal   Collection Time: 04/30/23  4:53 AM  Result Value Ref Range   Sodium 132 (L) 135 - 145 mmol/L   Potassium 3.1 (L) 3.5 - 5.1 mmol/L   Chloride 98 98 - 111 mmol/L   CO2 26 22 - 32 mmol/L   Glucose, Bld 157 (H) 70 - 99 mg/dL    Comment: Glucose reference range applies only to samples taken after fasting for at least 8 hours.   BUN 15 8 - 23 mg/dL   Creatinine, Ser 1.61 0.61 - 1.24 mg/dL   Calcium 8.1 (L) 8.9 - 10.3 mg/dL   GFR, Estimated >09 >60 mL/min    Comment: (NOTE) Calculated using the CKD-EPI Creatinine Equation (2021)    Anion gap 8 5 - 15    Comment: Performed at [facility], [address] [place], [place] [address]  CBC     Status: Abnormal   Collection Time: 04/30/23  4:53 AM  Result Value Ref Range   WBC 7.2 4.0 - 10.5 K/uL   RBC 5.34 4.22 - 5.81 MIL/uL   Hemoglobin 17.1 (H) 13.0 - 17.0 g/dL   HCT 81.1 91.4 - 78.2 %   MCV 93.6 80.0 - 100.0 fL   MCH 32.0 26.0 - 34.0 pg   MCHC 34.2 30.0 - 36.0 g/dL   RDW 95.6 21.3 - 08.6 %   Platelets 168 150 - 400 K/uL   nRBC 0.0 0.0 - 0.2 %    Comment: Performed at [facility], [address] [place], [place] [address]  Magnesium     Status: None   Collection Time: 04/30/23  4:53 AM  Result Value Ref Range   Magnesium 1.7 1.7 - 2.4 mg/dL    Comment: Performed at [facility], [address], [place], [place] [address]  Phosphorus     Status: None   Collection Time: 04/30/23  4:53 AM  Result Value Ref Range   Phosphorus 3.7 2.5 - 4.6 mg/dL    Comment: Performed at [facility], [address] [place], [place] [address]  Glucose, capillary     Status: Abnormal   Collection Time: 04/30/23  5:28 AM  Result Value Ref Range   Glucose-Capillary 186 (H) 70 - 99 mg/dL    Comment: Glucose reference range applies only to samples taken after fasting for at least 8 hours.  Glucose, capillary     Status: Abnormal   Collection Time: 04/30/23  6:42 AM  Result Value Ref Range   Glucose-Capillary 166 (H) 70 - 99 mg/dL    Comment: Glucose reference range applies only to samples taken after fasting for at least 8 hours.   Comment 1 Notify RN    Comment 2 Document in Chart   Glucose, capillary     Status: Abnormal   Collection Time: 04/30/23  7:33 AM  Result Value Ref Range   Glucose-Capillary 153 (H) 70 - 99 mg/dL    Comment: Glucose reference range applies only to samples taken after fasting for at least 8 hours.  Basic metabolic panel     Status: Abnormal   Collection Time: 04/30/23  8:32 AM  Result Value Ref Range   Sodium 130 (L) 135 - 145 mmol/L   Potassium 3.5 3.5 - 5.1 mmol/L   Chloride 98 98 - 111 mmol/L   CO2 24 22 - 32 mmol/L    Glucose, Bld 165 (H) 70 - 99 mg/dL    Comment: Glucose  reference range applies only to samples taken after fasting for at least 8 hours.   BUN 14 8 - 23 mg/dL   Creatinine, Ser 4.09 0.61 - 1.24 mg/dL   Calcium 8.0 (L) 8.9 - 10.3 mg/dL   GFR, Estimated >81 >19 mL/min    Comment: (NOTE) Calculated using the CKD-EPI Creatinine Equation (2021)    Anion gap 8 5 - 15    Comment: Performed at [facility], [address] [place], [place] [address]  Beta-hydroxybutyric acid     Status: None   Collection Time: 04/30/23  8:32 AM  Result Value Ref Range   Beta-Hydroxybutyric Acid 0.22 0.05 - 0.27 mmol/L    Comment: Performed at [facility], [address], [place], [place] [address]  TSH     Status: None   Collection Time: 04/30/23  8:32 AM  Result Value Ref Range   TSH 1.309 0.350 - 4.500 uIU/mL    Comment: Performed by a 3rd Generation assay with a functional sensitivity of <=0.01 uIU/mL. Performed at [facility], [address] [place], [place] [address]   Glucose, capillary     Status: Abnormal   Collection Time: 04/30/23  9:58 AM  Result Value Ref Range   Glucose-Capillary 157 (H) 70 - 99 mg/dL    Comment: Glucose reference range applies only to samples taken after fasting for at least 8 hours.  Glucose, capillary     Status: Abnormal   Collection Time: 04/30/23 11:18 AM  Result Value Ref Range   Glucose-Capillary 189 (H) 70 - 99 mg/dL    Comment: Glucose reference range applies only to samples taken after fasting for at least 8 hours.  ECHOCARDIOGRAM COMPLETE     Status: None   Collection Time: 04/30/23  3:55 PM  Result Value Ref Range   Weight 5,088 oz   Height 72 in   BP 121/83 mmHg   Area-P 1/2 5.88 cm2   S' Lateral 3.90 cm   AR max vel 1.74 cm2   AV Area mean vel 1.76 cm2   AV Area VTI 1.81 cm2   Est EF 35 - 40%    AV Peak grad 9.0 mmHg   Ao pk vel 1.50 m/s   AV Mean grad 4.6 mmHg  Glucose, capillary     Status: Abnormal   Collection Time: 04/30/23  4:06 PM  Result  Value Ref Range   Glucose-Capillary 234 (H) 70 - 99 mg/dL    Comment: Glucose reference range applies only to samples taken after fasting for at least 8 hours.  Glucose, capillary     Status: Abnormal   Collection Time: 04/30/23  8:24 PM  Result Value Ref Range   Glucose-Capillary 315 (H) 70 - 99 mg/dL    Comment: Glucose reference range applies only to samples taken after fasting for at least 8 hours.   Comment 1 Notify RN    Comment 2 Document in Chart   Glucose, capillary     Status: Abnormal   Collection Time: 05/01/23  7:48 AM  Result Value Ref Range   Glucose-Capillary 263 (H) 70 - 99 mg/dL    Comment: Glucose reference range applies only to samples taken after fasting for at least 8 hours.  Renal function panel     Status: Abnormal   Collection Time: 05/01/23  8:34 AM  Result Value Ref Range   Sodium 128 (L) 135 - 145 mmol/L   Potassium 4.4 3.5 - 5.1 mmol/L   Chloride 98 98 - 111 mmol/L  CO2 22 22 - 32 mmol/L   Glucose, Bld 278 (H) 70 - 99 mg/dL    Comment: Glucose reference range applies only to samples taken after fasting for at least 8 hours.   BUN 14 8 - 23 mg/dL   Creatinine, Ser 1.61 0.61 - 1.24 mg/dL   Calcium 8.1 (L) 8.9 - 10.3 mg/dL   Phosphorus 2.9 2.5 - 4.6 mg/dL   Albumin 3.1 (L) 3.5 - 5.0 g/dL   GFR, Estimated >09 >60 mL/min    Comment: (NOTE) Calculated using the CKD-EPI Creatinine Equation (2021)    Anion gap 8 5 - 15    Comment: Performed at [facility], [address], [place], [place] [address]  Glucose, capillary     Status: Abnormal   Collection Time: 05/01/23 11:15 AM  Result Value Ref Range   Glucose-Capillary 348 (H) 70 - 99 mg/dL    Comment: Glucose reference range applies only to samples taken after fasting for at least 8 hours.  Protime-INR     Status: None   Collection Time: 05/01/23  3:03 PM  Result Value Ref Range   Prothrombin Time 14.0 11.4 - 15.2 seconds   INR 1.1 0.8 - 1.2    Comment: (NOTE) INR goal varies based on device and  disease states. Performed at [facility], [address], [place], [place] [address]   Glucose, capillary     Status: Abnormal   Collection Time: 05/01/23  4:18 PM  Result Value Ref Range   Glucose-Capillary 262 (H) 70 - 99 mg/dL    Comment: Glucose reference range applies only to samples taken after fasting for at least 8 hours.  Glucose, capillary     Status: Abnormal   Collection Time: 05/01/23  9:06 PM  Result Value Ref Range   Glucose-Capillary 233 (H) 70 - 99 mg/dL    Comment: Glucose reference range applies only to samples taken after fasting for at least 8 hours.  Basic metabolic panel     Status: Abnormal   Collection Time: 05/02/23  4:26 AM  Result Value Ref Range   Sodium 132 (L) 135 - 145 mmol/L   Potassium 3.8 3.5 - 5.1 mmol/L   Chloride 100 98 - 111 mmol/L   CO2 25 22 - 32 mmol/L   Glucose, Bld 157 (H) 70 - 99 mg/dL    Comment: Glucose reference range applies only to samples taken after fasting for at least 8 hours.   BUN 16 8 - 23 mg/dL   Creatinine, Ser 4.78 0.61 - 1.24 mg/dL   Calcium 8.1 (L) 8.9 - 10.3 mg/dL   GFR, Estimated >29 >56 mL/min    Comment: (NOTE) Calculated using the CKD-EPI Creatinine Equation (2021)    Anion gap 7 5 - 15    Comment: Performed at [facility], [address] [place], [place] [address]  Glucose, capillary     Status: Abnormal   Collection Time: 05/02/23  7:27 AM  Result Value Ref Range   Glucose-Capillary 200 (H) 70 - 99 mg/dL    Comment: Glucose reference range applies only to samples taken after fasting for at least 8 hours.  Glucose, capillary     Status: Abnormal   Collection Time: 05/02/23 11:37 AM  Result Value Ref Range   Glucose-Capillary 149 (H) 70 - 99 mg/dL    Comment: Glucose reference range applies only to samples taken after fasting for at least 8 hours.  Glucose, capillary     Status: Abnormal   Collection Time:  05/02/23 12:21 PM  Result Value Ref Range   Glucose-Capillary 147 (H) 70 - 99 mg/dL    Comment:  Glucose reference range applies only to samples taken after fasting for at least 8 hours.  Glucose, capillary     Status: Abnormal   Collection Time: 05/02/23  2:12 PM  Result Value Ref Range   Glucose-Capillary 119 (H) 70 - 99 mg/dL    Comment: Glucose reference range applies only to samples taken after fasting for at least 8 hours.  Glucose, capillary     Status: Abnormal   Collection Time: 05/02/23  3:48 PM  Result Value Ref Range   Glucose-Capillary 197 (H) 70 - 99 mg/dL    Comment: Glucose reference range applies only to samples taken after fasting for at least 8 hours.  Comprehensive metabolic panel     Status: Abnormal   Collection Time: 05/09/23  3:10 PM  Result Value Ref Range   Glucose 132 (H) 70 - 99 mg/dL   BUN 20 8 - 27 mg/dL   Creatinine, Ser 4.78 0.76 - 1.27 mg/dL   eGFR 85 >29 FA/OZH/0.86   BUN/Creatinine Ratio 20 10 - 24   Sodium 137 134 - 144 mmol/L   Potassium 4.5 3.5 - 5.2 mmol/L   Chloride 99 96 - 106 mmol/L   CO2 23 20 - 29 mmol/L   Calcium 10.1 8.6 - 10.2 mg/dL   Total Protein 7.2 6.0 - 8.5 g/dL   Albumin 4.2 3.9 - 4.9 g/dL   Globulin, Total 3.0 1.5 - 4.5 g/dL   Albumin/Globulin Ratio 1.4 1.2 - 2.2   Bilirubin Total 0.6 0.0 - 1.2 mg/dL   Alkaline Phosphatase 109 44 - 121 IU/L   AST 41 (H) 0 - 40 IU/L   ALT 80 (H) 0 - 44 IU/L  Magnesium     Status: None   Collection Time: 05/09/23  3:10 PM  Result Value Ref Range   Magnesium 2.0 1.6 - 2.3 mg/dL  CBC     Status: Abnormal   Collection Time: 05/09/23  3:10 PM  Result Value Ref Range   WBC 8.0 3.4 - 10.8 x10E3/uL   RBC 5.92 (H) 4.14 - 5.80 x10E6/uL   Hemoglobin 19.1 (H) 13.0 - 17.7 g/dL   Hematocrit 57.8 (H) 46.9 - 51.0 %   MCV 94 79 - 97 fL   MCH 32.3 26.6 - 33.0 pg   MCHC 34.4 31.5 - 35.7 g/dL   RDW 62.9 52.8 - 41.3 %   Platelets 252 150 - 450 x10E3/uL  Iron and TIBC ([facility] ONLY)     Status: None   Collection Time: 06/01/23 12:24 PM  Result Value Ref Range   Iron 123 45 -  182 ug/dL   TIBC 244 010 - 272 ug/dL   Saturation Ratios 37 17.9 - 39.5 %   UIBC 209 ug/dL    Comment: Performed at [facility], [address], [place], [place] [address]  Ferritin     Status: None   Collection Time: 06/01/23 12:24 PM  Result Value Ref Range   Ferritin 215 24 - 336 ng/mL    Comment: Performed at [facility], [address] [place], [place] [address]  Lactate dehydrogenase     Status: None   Collection Time: 06/01/23 12:25 PM  Result Value Ref Range   LDH 134 98 - 192 U/L    Comment: Performed at [facility], [address] [place], [place] [address]  Urinalysis, Routine w reflex microscopic  Status: None   Collection Time: 06/01/23 12:25 PM  Result Value Ref Range   Color, Urine YELLOW YELLOW   APPearance CLEAR CLEAR   Specific Gravity, Urine 1.020 1.005 - 1.030   pH 6.0 5.0 - 8.0   Glucose, UA NEGATIVE NEGATIVE mg/dL   Hgb urine dipstick NEGATIVE NEGATIVE   Bilirubin Urine NEGATIVE NEGATIVE   Ketones, ur NEGATIVE NEGATIVE mg/dL   Protein, ur NEGATIVE NEGATIVE mg/dL   Nitrite NEGATIVE NEGATIVE   Leukocytes,Ua NEGATIVE NEGATIVE    Comment: Performed at [facility], [address], [place], [place] [address]  JAK2 V617F rfx CALR/MPL/E12-15     Status: None   Collection Time: 06/01/23 12:25 PM  Result Value Ref Range   JAK2 V617F Result Comment     Comment: (NOTE) NEGATIVE The JAK2 V617F mutation is not detected in the provided specimen of this individual. Results should be interpreted in conjunction with clinical and other laboratory findings for the most accurate interpretation. This test was developed and its performance characteristics determined by [facility]. It has not been cleared or approved by the Food and Drug Administration.    Reflex Comment     Comment: (NOTE) Reflex to CALR Mutation Analysis, JAK2 Exon 12-15 Mutation Analysis, and MPL Mutation Analysis is indicated.    V617F Rfx CALR/MPL/E12-15 Bkgd Comment     Comment:  (NOTE) Molecular testing of blood or bone marrow is useful in the evaluation of suspected myeloproliferative neoplasms (MPN). Mutations in the JAK2, MPL, and CALR genes are present in virtually all MPNs and their presence help distinguish benign reactive processes from clonal neoplasms. These mutations are generally considered mutually exclusive, although concurrent clones have been reported in rare patients. This test will assess for the JAK2V617F (exon 14) mutation first and will reflex to CALR mutation analysis, MPL mutation analysis, and JAK2 exon 12 to 15 mutation analysis if the JAK2V617F mutation is negative. The JAK2 (Janus kinase 2) gene encodes for a non-receptor protein tyrosine kinase that activates cytokine and growth factor signaling. The V617F (c.1849 G>T) mutation results in constitutive activation of JAK2 and downstream STAT5 and ERK signaling. The V617F mutation is observed in approximately 95% of polycythemia vera (PV), 60% of essential thrombocythemi a (ET) and primary myelofibrosis (PMF). It is also infrequently present (3-5%) in myelodysplastic syndrome, chronic myelomonocytic leukemia, and other atypical chronic myeloid disorders. A small percentage of JAK2 mutation positive patients (3.3%) contain other non-V617F mutations within exons 12 to 15. In particular, mutations in exon 12 of JAK2 have been described in approximately 3% of patients with PV. JAK2 allele burden correlates with clinical phenotype, with low levels of mutant allele characterized by thrombocytosis, intermediate levels with erythrocytosis, and high mutant allele burden correlating with enhanced myelopoiesis of the BM, leukocytosis, increasing spleen size, and circulating CD34-positive cells. The CALR (Calreticulin) gene encodes for a multifunctional calcium-binding protein involved in many cellular activities such as growth, proliferation, adhesion, and programmed cell death. Among patients  with JAK2 negative MPNs, CALR are found in approximat ely 70% of patients with JAK2-negative essential thrombocythemia (ET) and 60-88% of patients with JAK2-negative primary myelofibrosis(PMF). Only a minority of patients (approximately 8%) with myelodysplasia have mutations in the CALR gene. CALR mutations are rarely detected in patients with de novo acute myeloid leukemia, chronic myelogenous leukemia, lymphoid leukemia, or solid tumors. CALR mutations are not detected in polycythemia and generally appear to be mutually exclusive with JAK2 mutations and MPL mutations. The majority of mutational changes involve a variety of insertion deletion mutations  in exon 9 of the calreticulin gene: approximately 53% of all CALR mutations are a 52 bp deletion (type-1) while the second most prevalent mutation (approximately 32%) contains a 5 bp insertion (type-2). Other mutations (non-type 1 or type 2) are seen in a small minority of cases. CALR mutations in PMF tend to be with a favorable prognosis compared to JAK2 V617F mutations, w hereas primary myelofibrosis negative for CALR, JAK2 V617F and MPL mutations (so-called triple negative) is associated with a poor prognosis and shorter survival. The MPL (myeloproliferative leukemia virus oncogene) gene encodes the thrombopoietin receptor which regulates hematopoiesis and megakaryopoiesis. Activating MPL mutations are associated with a subset of myeloproliferative neoplasms and acute megakaryoblastic leukemia. MPL W515 mutations are present in approximately 5-8% of patients with primary myelofibrosis (PMF) and 1-4% of patients with essential thrombocythemia (ET). The S505 mutation is detected in patients with hereditary thrombocythemia. Limitations This assay has a sensitivity of approximately 1% VAF for JAK2 V617F, 2.5% VAF for other mutations in JAK2 exons 12 to 15, CALR mutations, and MPL mutations.    Method based next generation sequencing.      Comment: Comment [employer]    References Comment     Comment: (NOTE) Alghasham N, Alnouri Y, Abalkhail H, Clarita Leber. Detection of mutations in JAK2 exons 12-15 by [employer] sequencing. Int J Lab Hematol. 2016 Feb;38(1):34-41. doi: 10.1111/ijlh.60454. Epub 2015 Sep 11. PMID: 09811914. Pura Spice, Unk Lightning, Hasserjian R, Patrica Duel, Borowitz MJ, Leroy Libman MM, Halaula CD, [facility], Vardiman JW. The 2016 revision to the World Health Organization classification of myeloid neoplasms and acute leukemia. Blood. 2016 May 19;127(20):2391-405. doi: 10.1182/blood-2016-03-643544. Epub 2016 Apr 11. PMID: 78295621. Genevie Ann [facility], Zhang ZJ, Riverside S, Albitar M. Mutation profile of JAK2 transcripts in patients with chronic myeloproliferative neoplasias. J Mol Diagn. 2009 Jan;11(1):49-53.doi: 10.2353/jmoldx.2009.080114. Epub 2008 Dec 12. PMID: 30865784; PMCID: ONG2952841. NCCN Clinical Practice Guidelines in Oncology (NCCN Guidelines) Myeloproliferative Neoplasms Version 3.2022 - July 21, 2021. Swerdlow SH, Programmer, multimedia. WHO classif ication of Tumours of Haematopoietic and Lymphoid Tissues. 4th edn. Jaci Standard, [place]: Geologist, engineering for [employer] on [employer]; 2017. Tefferi A. Primary myelofibrosis: 2021 update on diagnosis, risk-stratification and management. Am J Hematol. 2021 Jan;96(1):145-162. doi: 10.1002/ajh.26050. Epub 2020 Dec 2. PMID: 32440102. Royetta Car, Kralovics R. Genetic basis and molecular pathophysiology of classical myeloproliferative neoplasms. Blood. 2017 Feb 9;129(6):667-679. doi: 10.1182/blood-2016-10-695940. Epub 2016 Dec 27. PMID: 72536644.    Director Review Comment     Comment: (NOTE) Karilyn Cota, PhD, [facility] Director, Molecular Oncology [facility] [place], [place] [address] [phone] Performed At: [facility] [address] [place], [place] [id number] Maurine Simmering MDPhD [id number] Performed At:  [facility] [address] [place], [place] [id number] Maurine Simmering MDPhD KG:[id number]   Erythropoietin     Status: None   Collection Time: 06/01/23 12:25 PM  Result Value Ref Range   Erythropoietin 9.0 2.6 - 18.5 mIU/mL    Comment: (NOTE) Beckman Coul[employer] Uni[employer] DxI 800 Immunoassay System Values obtained with different assay methods or kits cannot be used interchangeably. Results cannot be interpreted as absolute evidence of the presence or absence of malignant disease. Performed At: [facility] [address] [place], [place] [id number] Jolene Schimke MD HY:[id number]   CBC with Differential     Status: Abnormal   Collection Time: 06/01/23 12:25 PM  Result Value Ref Range   WBC 6.5 4.0 - 10.5 K/uL   RBC 5.44 4.22 -  5.81 MIL/uL   Hemoglobin 17.3 (H) 13.0 - 17.0 g/dL   HCT 40.9 81.1 - 91.4 %   MCV 94.3 80.0 - 100.0 fL   MCH 31.8 26.0 - 34.0 pg   MCHC 33.7 30.0 - 36.0 g/dL   RDW 78.2 95.6 - 21.3 %   Platelets 193 150 - 400 K/uL   nRBC 0.0 0.0 - 0.2 %   Neutrophils Relative % 59 %   Neutro Abs 3.8 1.7 - 7.7 K/uL   Lymphocytes Relative 27 %   Lymphs Abs 1.8 0.7 - 4.0 K/uL   Monocytes Relative 8 %   Monocytes Absolute 0.5 0.1 - 1.0 K/uL   Eosinophils Relative 5 %   Eosinophils Absolute 0.3 0.0 - 0.5 K/uL   Basophils Relative 1 %   Basophils Absolute 0.1 0.0 - 0.1 K/uL   Immature Granulocytes 0 %   Abs Immature Granulocytes 0.02 0.00 - 0.07 K/uL    Comment: Performed at [facility], [address], [place], [place] [address]  CALR +MPL + E12-E15 (reflexed)     Status: None   Collection Time: 06/01/23 12:25 PM  Result Value Ref Range   CALR Result Comment     Comment: (NOTE) NEGATIVE No insertions or deletions were detected within the analyzed region of the calreticulin (CALR) gene. A negative result does not entirely exclude the possibility of a clonal population carrying CALR gene mutations that are not covered by this assay. Results should be  interpreted in conjunction with clinical and laboratory findings for the most accurate interpretation.    MPL Result Comment     Comment: (NOTE) NEGATIVE No MPL mutation was identified in the provided specimen of this individual. Results should be interpreted in conjunction with clinical and other laboratory findings for the most accurate interpretation.    E12-15 Result Comment     Comment: (NOTE) NEGATIVE JAK2 mutations were not detected in exons 12, 13, 14 and 15. This result does not rule out the presence of JAK2 mutation at a level below the detection sensitivity of this assay, the presence of other mutations outside the analyzed region of the JAK2 gene, or the presence of a myeloproliferative or other neoplasm. Result must be correlated with other clinical data for the most accurate diagnosis. Performed At: [facility] [address] [place], [place] [id number] Maurine Simmering MDPhD WU:[id number]   ECHOCARDIOGRAM COMPLETE     Status: None   Collection Time: 06/22/23 12:33 PM  Result Value Ref Range   Single Plane A2C EF 52.2 %   Single Plane A4C EF 49.6 %   Calc EF 49.6 %   Area-P 1/2 3.48 cm2   S' Lateral 3.70 cm   AR max vel 1.75 cm2   AV Area mean vel 1.64 cm2   AV Area VTI 1.92 cm2   Est EF 50    AV Peak grad 7.7 mmHg   Ao pk vel 1.39 m/s   AV Mean grad 4.0 mmHg    RADIOGRAPHIC STUDIES: I have personally reviewed the radiological images as listed and agreed with the findings in the report. ECHOCARDIOGRAM COMPLETE  Result Date: 06/22/2023    ECHOCARDIOGRAM REPORT   Patient Name:   TREVON STROTHERS Date of Exam: 06/22/2023 Medical Rec #:  [id number]     Height:       72.0 in Accession #:    [id number]    Weight:       311.6 lb Date of Birth:  28-Dec-1960  BSA:          2.572 m Patient Age:    62 years      BP:           120/81 mmHg Patient Gender: M             HR:           85 bpm. [facility] Procedure: 2D Echo, Cardiac Doppler and Color  Doppler Indications:    HFrEF (heart failure with reduced ejection fraction) (HCC)                 [[id number]]  History:        Patient has prior history of Echocardiogram examinations, most                 recent 05/02/2023. Cardiomyopathy, Arrythmias:Atrial Flutter;                 Risk Factors:Diabetes and Dyslipidemia. History of smoking.                 Sleep apnea on CPAP (From Hx).  Sonographer:    Celesta Gentile RCS Referring Phys: [phone] DAYNA N DUNN IMPRESSIONS  1. Left ventricular ejection fraction, by estimation, is 50%. The left ventricle has low normal function. The left ventricle has no regional wall motion abnormalities. There is moderate left ventricular hypertrophy. Left ventricular diastolic parameters  are consistent with Grade I diastolic dysfunction (impaired relaxation).  2. Right ventricular systolic function is normal. The right ventricular size is normal.  3. The mitral valve is normal in structure. No evidence of mitral valve regurgitation. No evidence of mitral stenosis.  4. The aortic valve has an indeterminant number of cusps. Aortic valve regurgitation is not visualized. No aortic stenosis is present.  5. The inferior vena cava is normal in size with greater than 50% respiratory variability, suggesting right atrial pressure of 3 mmHg. FINDINGS  Left Ventricle: Left ventricular ejection fraction, by estimation, is 50%. The left ventricle has low normal function. The left ventricle has no regional wall motion abnormalities. The left ventricular internal cavity size was normal in size. There is moderate left ventricular hypertrophy. Left ventricular diastolic parameters are consistent with Grade I diastolic dysfunction (impaired relaxation). Normal left ventricular filling pressure. Right Ventricle: The right ventricular size is normal. Right vetricular wall thickness was not well visualized. Right ventricular systolic function is normal. Left Atrium: Left atrial size was normal in size. Right  Atrium: Right atrial size was normal in size. Pericardium: There is no evidence of pericardial effusion. Mitral Valve: The mitral valve is normal in structure. No evidence of mitral valve regurgitation. No evidence of mitral valve stenosis. Tricuspid Valve: The tricuspid valve is normal in structure. Tricuspid valve regurgitation is not demonstrated. No evidence of tricuspid stenosis. Aortic Valve: The aortic valve has an indeterminant number of cusps. Aortic valve regurgitation is not visualized. No aortic stenosis is present. Aortic valve mean gradient measures 4.0 mmHg. Aortic valve peak gradient measures 7.7 mmHg. Aortic valve area, by VTI measures 1.92 cm. Pulmonic Valve: The pulmonic valve was not well visualized. Pulmonic valve regurgitation is not visualized. No evidence of pulmonic stenosis. Aorta: The aortic root is normal in size and structure. Venous: The inferior vena cava is normal in size with greater than 50% respiratory variability, suggesting right atrial pressure of 3 mmHg. IAS/Shunts: No atrial level shunt detected by color flow Doppler.  LEFT VENTRICLE PLAX 2D LVIDd:  4.50 cm      Diastology LVIDs:         3.70 cm      LV e' medial:    7.62 cm/s LV PW:         1.20 cm      LV E/e' medial:  8.8 LV IVS:        1.40 cm      LV e' lateral:   11.30 cm/s LVOT diam:     1.80 cm      LV E/e' lateral: 5.9 LV SV:         49 LV SV Index:   19 LVOT Area:     2.54 cm  LV Volumes (MOD) LV vol d, MOD A2C: 151.0 ml LV vol d, MOD A4C: 105.0 ml LV vol s, MOD A2C: 72.2 ml LV vol s, MOD A4C: 52.9 ml LV SV MOD A2C:     78.8 ml LV SV MOD A4C:     105.0 ml LV SV MOD BP:      63.5 ml RIGHT VENTRICLE RV S prime:     12.20 cm/s TAPSE (M-mode): 2.4 cm LEFT ATRIUM             Index        RIGHT ATRIUM           Index LA diam:        4.00 cm 1.55 cm/m   RA Area:     20.00 cm LA Vol (A2C):   63.7 ml 24.76 ml/m  RA Volume:   64.90 ml  25.23 ml/m LA Vol (A4C):   62.8 ml 24.41 ml/m LA Biplane Vol: 66.2 ml 25.73  ml/m  AORTIC VALVE AV Area (Vmax):    1.75 cm AV Area (Vmean):   1.64 cm AV Area (VTI):     1.92 cm AV Vmax:           138.56 cm/s AV Vmean:          93.167 cm/s AV VTI:            0.256 m AV Peak Grad:      7.7 mmHg AV Mean Grad:      4.0 mmHg LVOT Vmax:         95.30 cm/s LVOT Vmean:        60.000 cm/s LVOT VTI:          0.193 m LVOT/AV VTI ratio: 0.75  AORTA Ao Root diam: 3.70 cm MITRAL VALVE MV Area (PHT): 3.48 cm    SHUNTS MV Decel Time: 218 msec    Systemic VTI:  0.19 m MV E velocity: 66.80 cm/s  Systemic Diam: 1.80 cm MV A velocity: 84.70 cm/s MV E/A ratio:  0.79 Dina Rich MD Electronically signed by Dina Rich MD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

## 2023-07-12 NOTE — Progress Notes (Deleted)
Cardiology Office Note   Date:  07/12/2023   ID:  David Hammond, DOB 20-Feb-1961, MRN [id number]  PCP:  Rica Records, FNP  Cardiologist:   Dietrich Pates, MD       History of Present Illness: David Hammond is a 62 y.o. male with a history of       No outpatient medications have been marked as taking for the 07/13/23 encounter (Appointment) with Pricilla Riffle, MD.     Allergies:   Trazodone and Fenofibrate   Past Medical History:  Diagnosis Date   Allergy 2016   Trazodone   Arthritis 1967   Knees   Atrial flutter (HCC)    Cardiomyopathy (HCC)    Chronic HFrEF (heart failure with reduced ejection fraction) (HCC)    Diabetes mellitus (HCC) 2019   GERD (gastroesophageal reflux disease) 2015   Hepatic steatosis    Hypercholesterolemia 04/29/2023   Morbid obesity (HCC)    Sleep apnea 2015   Controlled with Tx CPAP    Past Surgical History:  Procedure Laterality Date   CARDIOVERSION N/A 05/02/2023   Procedure: CARDIOVERSION;  Surgeon: Pricilla Riffle, MD;  Location: [facility];  Service: Cardiovascular;  Laterality: N/A;   HERNIA REPAIR  2012   Umbilical   TEE WITHOUT CARDIOVERSION N/A 05/02/2023   Procedure: TRANSESOPHAGEAL ECHOCARDIOGRAM (TEE);  Surgeon: Pricilla Riffle, MD;  Location: [facility];  Service: Cardiovascular;  Laterality: N/A;   VASECTOMY       Social History:  The patient  reports that he has been smoking pipe and cigarettes. He has never used smokeless tobacco. He reports current alcohol use. He reports that he does not use drugs.   Family History:  The patient's family history includes Arthritis in his father and mother; COPD in his father; Cancer in his father; Diabetes in his father; Heart disease in his father; Hyperlipidemia in his father; Hypertension in his father; Kidney disease in his father; Stroke in his father; Varicose Veins in his mother.    ROS:  Please see the history of present illness. All other systems are reviewed and  Negative to  the above problem except as noted.    PHYSICAL EXAM: VS:  There were no vitals taken for this visit.  GEN: Well nourished, well developed, in no acute distress  HEENT: normal  Neck: no JVD, carotid bruits, or masses Cardiac: RRR; no murmurs, rubs, or gallops,no edema  Respiratory:  clear to auscultation bilaterally, normal work of breathing GI: soft, nontender, nondistended, + BS  No hepatomegaly  MS: no deformity Moving all extremities   Skin: warm and dry, no rash Neuro:  Strength and sensation are intact Psych: euthymic mood, full affect   EKG:  EKG is ordered today.   Lipid Panel    Component Value Date/Time   CHOL 211 (H) 04/06/2023 0807   TRIG 441 (H) 04/06/2023 0807   HDL 63 04/06/2023 0807   CHOLHDL 3.3 04/06/2023 0807   LDLCALC 78 04/06/2023 0807      Wt Readings from Last 3 Encounters:  06/08/23 (!) 311 lb 9.6 oz (141.3 kg)  06/01/23 (!) 309 lb 1.6 oz (140.2 kg)  05/09/23 (!) 307 lb (139.3 kg)      ASSESSMENT AND PLAN:     Current medicines are reviewed at length with the patient today.  The patient does not have concerns regarding medicines.  Signed, Dietrich Pates, MD  07/12/2023 10:34 PM    [facility] [address]  [address], [place], [place]  [address] Phone: [phone]; Fax: [phone]

## 2023-07-13 ENCOUNTER — Ambulatory Visit: Payer: Federal, State, Local not specified - PPO | Admitting: Internal Medicine

## 2023-07-16 DIAGNOSIS — G4730 Sleep apnea, unspecified: Secondary | ICD-10-CM | POA: Diagnosis not present

## 2023-07-16 DIAGNOSIS — E1142 Type 2 diabetes mellitus with diabetic polyneuropathy: Secondary | ICD-10-CM | POA: Diagnosis not present

## 2023-07-16 DIAGNOSIS — I4892 Unspecified atrial flutter: Secondary | ICD-10-CM | POA: Diagnosis not present

## 2023-07-26 DIAGNOSIS — G4733 Obstructive sleep apnea (adult) (pediatric): Secondary | ICD-10-CM | POA: Diagnosis not present

## 2023-07-26 DIAGNOSIS — Z7985 Long-term (current) use of injectable non-insulin antidiabetic drugs: Secondary | ICD-10-CM | POA: Diagnosis not present

## 2023-07-26 DIAGNOSIS — I484 Atypical atrial flutter: Secondary | ICD-10-CM | POA: Diagnosis not present

## 2023-07-26 DIAGNOSIS — Z794 Long term (current) use of insulin: Secondary | ICD-10-CM | POA: Diagnosis not present

## 2023-07-26 DIAGNOSIS — Z6841 Body Mass Index (BMI) 40.0 and over, adult: Secondary | ICD-10-CM | POA: Diagnosis not present

## 2023-07-26 DIAGNOSIS — I4892 Unspecified atrial flutter: Secondary | ICD-10-CM | POA: Diagnosis not present

## 2023-07-26 DIAGNOSIS — Z7901 Long term (current) use of anticoagulants: Secondary | ICD-10-CM | POA: Diagnosis not present

## 2023-07-26 DIAGNOSIS — Z79899 Other long term (current) drug therapy: Secondary | ICD-10-CM | POA: Diagnosis not present

## 2023-07-26 DIAGNOSIS — E1142 Type 2 diabetes mellitus with diabetic polyneuropathy: Secondary | ICD-10-CM | POA: Diagnosis not present

## 2023-07-26 DIAGNOSIS — I483 Typical atrial flutter: Secondary | ICD-10-CM | POA: Diagnosis not present

## 2023-07-31 DIAGNOSIS — M75121 Complete rotator cuff tear or rupture of right shoulder, not specified as traumatic: Secondary | ICD-10-CM | POA: Diagnosis not present

## 2023-08-08 ENCOUNTER — Ambulatory Visit: Payer: Federal, State, Local not specified - PPO | Admitting: Family Medicine

## 2023-08-10 DIAGNOSIS — G4730 Sleep apnea, unspecified: Secondary | ICD-10-CM | POA: Diagnosis not present

## 2023-08-10 DIAGNOSIS — I4892 Unspecified atrial flutter: Secondary | ICD-10-CM | POA: Diagnosis not present

## 2023-08-10 DIAGNOSIS — E1142 Type 2 diabetes mellitus with diabetic polyneuropathy: Secondary | ICD-10-CM | POA: Diagnosis not present

## 2023-08-10 DIAGNOSIS — Z9889 Other specified postprocedural states: Secondary | ICD-10-CM | POA: Diagnosis not present

## 2023-08-11 DIAGNOSIS — J189 Pneumonia, unspecified organism: Secondary | ICD-10-CM | POA: Diagnosis not present

## 2023-08-11 DIAGNOSIS — Z6841 Body Mass Index (BMI) 40.0 and over, adult: Secondary | ICD-10-CM | POA: Diagnosis not present

## 2023-08-11 DIAGNOSIS — I10 Essential (primary) hypertension: Secondary | ICD-10-CM | POA: Diagnosis not present

## 2023-08-14 DIAGNOSIS — M25511 Pain in right shoulder: Secondary | ICD-10-CM | POA: Diagnosis not present

## 2023-08-21 DIAGNOSIS — M75121 Complete rotator cuff tear or rupture of right shoulder, not specified as traumatic: Secondary | ICD-10-CM | POA: Diagnosis not present

## 2023-09-11 DIAGNOSIS — B9689 Other specified bacterial agents as the cause of diseases classified elsewhere: Secondary | ICD-10-CM | POA: Diagnosis not present

## 2023-09-11 DIAGNOSIS — J208 Acute bronchitis due to other specified organisms: Secondary | ICD-10-CM | POA: Diagnosis not present

## 2023-09-18 NOTE — H&P (Signed)
Patient's anticipated LOS is less than 2 midnights, meeting these requirements: - Younger than 44 - Lives within 1 hour of care - Has a competent adult at home to recover with post-op recover - NO history of  - Chronic pain requiring opiods  - Diabetes  - Coronary Artery Disease  - Heart failure  - Heart attack  - Stroke  - DVT/VTE  - Cardiac arrhythmia  - Respiratory Failure/COPD  - Renal failure  - Anemia  - Advanced Liver disease     David Hammond is an 62 y.o. male.    Chief Complaint: right shoulder pain  HPI: Pt is a 62 y.o. male complaining of right shoulder pain for multiple years. Pain had continually increased since the beginning. X-rays in the clinic show rotator cuff tear right shoulder. Pt has tried various conservative treatments which have failed to alleviate their symptoms, including injections and therapy. Various options are discussed with the patient. Risks, benefits and expectations were discussed with the patient. Patient understand the risks, benefits and expectations and wishes to proceed with surgery.   PCP:  Rica Records, FNP  D/C Plans: Home  PMH: Past Medical History:  Diagnosis Date   Allergy 2016   Trazodone   Arthritis 1967   Knees   Atrial flutter (HCC)    Cardiomyopathy (HCC)    Chronic HFrEF (heart failure with reduced ejection fraction) (HCC)    Diabetes mellitus (HCC) 2019   GERD (gastroesophageal reflux disease) 2015   Hepatic steatosis    Hypercholesterolemia 04/29/2023   Morbid obesity (HCC)    Sleep apnea 2015   Controlled with Tx CPAP    PSH: Past Surgical History:  Procedure Laterality Date   CARDIOVERSION N/A 05/02/2023   Procedure: CARDIOVERSION;  Surgeon: Pricilla Riffle, MD;  Location: [facility];  Service: Cardiovascular;  Laterality: N/A;   HERNIA REPAIR  2012   Umbilical   TEE WITHOUT CARDIOVERSION N/A 05/02/2023   Procedure: TRANSESOPHAGEAL ECHOCARDIOGRAM (TEE);  Surgeon: Pricilla Riffle, MD;  Location: [facility];  Service: Cardiovascular;  Laterality: N/A;   VASECTOMY      Social History:  reports that he has been smoking pipe and cigarettes. He has never used smokeless tobacco. He reports current alcohol use. He reports that he does not use drugs. BMI: There is no height or weight on file to calculate BMI.  Lab Results  Component Value Date   ALBUMIN 4.2 05/09/2023   Diabetes:   Patient has a diagnosis of diabetes,  Lab Results  Component Value Date   HGBA1C 8.4 (H) 04/06/2023   Smoking Status: Social History   Tobacco Use  Smoking Status Light Smoker   Current packs/day: 0.00   Types: Pipe, Cigarettes  Smokeless Tobacco Never  Tobacco Comments   1-2 pipes per day. Used to smoke cigarettes. Quit about 2015. Use nicotine replacement gum/mints.   Ready to quit: Not Answered Counseling given: Not Answered Tobacco comments: 1-2 pipes per day. Used to smoke cigarettes. Quit about 2015. Use nicotine replacement gum/mints.  The patient has participated in a 4-week cessation program.          Allergies:  Allergies  Allergen Reactions   Trazodone Palpitations and Shortness Of Breath   Fenofibrate Nausea Only    Medications: No current facility-administered medications for this encounter.   Current Outpatient Medications  Medication Sig Dispense Refill   apixaban (ELIQUIS) 5 MG TABS tablet Take 1 tablet (5 mg total) by mouth 2 (two) times daily. 60  tablet 2   Continuous Glucose Sensor (FREESTYLE LIBRE 3 SENSOR) MISC by Does not apply route.     Dulaglutide (TRULICITY) 1.5 MG/0.5ML SOPN Inject 1.5 mg into the skin once a week. 6 mL 0   folic acid (FOLVITE) 1 MG tablet Take 1 tablet (1 mg total) by mouth daily. 30 tablet 1   Insulin Glargine (BASAGLAR KWIKPEN) 100 UNIT/ML Inject 15 Units into the skin at bedtime.     Insulin Pen Needle (PEN NEEDLES) 31G X 8 MM MISC 1 Units by Does not apply route as directed. 50 each 2   metoprolol succinate (TOPROL XL) 25 MG 24 hr tablet  Take 1 tablet (25 mg total) by mouth daily. 90 tablet 3   Needles & Syringes MISC      Needles & Syringes MISC      omega-3 acid ethyl esters (LOVAZA) 1 g capsule Take 2 capsules by mouth 2 (two) times daily.     SYRINGE/NEEDLE, DISP, 1 ML 23G X 1" 1 ML MISC Use to administer testosterone 50 each 2   Syringe/Needle, Disp, 18G X 1" 1 ML MISC Use to draw up testosterone 50 each 2   Testosterone Cypionate 200 MG/ML SOLN Inject 0.5 mLs into the muscle every 7 (seven) days.     thiamine (VITAMIN B-1) 100 MG tablet Take 1 tablet (100 mg total) by mouth daily. 30 tablet 1    No results found for this or any previous visit (from the past 48 hour(s)). No results found.  ROS: Pain with rom of the right upper extremity  Physical Exam: Alert and oriented 62 y.o. male in no acute distress Cranial nerves 2-12 intact Cervical spine: full rom with no tenderness, nv intact distally Chest: active breath sounds bilaterally, no wheeze rhonchi or rales Heart: regular rate and rhythm, no murmur Abd: non tender non distended with active bowel sounds Hip is stable with rom  Right shoulder painful and weak rom Nv intact distally No rashes or edema distally  Assessment/Plan Assessment: right shoulder rotator cuff tear  Plan:  Patient will undergo a right shoulder cuff tear by Dr. Ranell Patrick at [place] Risks benefits and expectations were discussed with the patient. Patient understand risks, benefits and expectations and wishes to proceed. Preoperative templating of the joint replacement has been completed, documented, and submitted to the Operating Room personnel in order to optimize intra-operative equipment management.   Alphonsa Overall PA-C, MPAS [facility] is now [employer] [address], [address], [place], [place] [address] Phone: [phone] [email or web address] Facebook  [employer]

## 2023-10-01 ENCOUNTER — Encounter (HOSPITAL_COMMUNITY)
Admission: RE | Admit: 2023-10-01 | Discharge: 2023-10-01 | Disposition: A | Discharge status: Home / Self Care | Payer: Federal, State, Local not specified - PPO | Source: Ambulatory Visit | Attending: Orthopedic Surgery | Admitting: Orthopedic Surgery

## 2023-10-01 ENCOUNTER — Encounter (HOSPITAL_COMMUNITY): Payer: Self-pay

## 2023-10-01 ENCOUNTER — Other Ambulatory Visit: Payer: Self-pay

## 2023-10-01 VITALS — BP 153/86 | HR 77 | Resp 16 | Ht 72.0 in | Wt 317.0 lb

## 2023-10-01 DIAGNOSIS — Z01812 Encounter for preprocedural laboratory examination: Secondary | ICD-10-CM | POA: Diagnosis not present

## 2023-10-01 DIAGNOSIS — I5022 Chronic systolic (congestive) heart failure: Secondary | ICD-10-CM | POA: Diagnosis not present

## 2023-10-01 DIAGNOSIS — Z794 Long term (current) use of insulin: Secondary | ICD-10-CM | POA: Insufficient documentation

## 2023-10-01 DIAGNOSIS — F17200 Nicotine dependence, unspecified, uncomplicated: Secondary | ICD-10-CM | POA: Diagnosis not present

## 2023-10-01 DIAGNOSIS — G4730 Sleep apnea, unspecified: Secondary | ICD-10-CM | POA: Diagnosis not present

## 2023-10-01 DIAGNOSIS — Z7901 Long term (current) use of anticoagulants: Secondary | ICD-10-CM | POA: Insufficient documentation

## 2023-10-01 DIAGNOSIS — M75101 Unspecified rotator cuff tear or rupture of right shoulder, not specified as traumatic: Secondary | ICD-10-CM | POA: Insufficient documentation

## 2023-10-01 DIAGNOSIS — E119 Type 2 diabetes mellitus without complications: Secondary | ICD-10-CM | POA: Insufficient documentation

## 2023-10-01 DIAGNOSIS — I4891 Unspecified atrial fibrillation: Secondary | ICD-10-CM | POA: Insufficient documentation

## 2023-10-01 HISTORY — DX: Pneumonia, unspecified organism: J18.9

## 2023-10-01 LAB — HEMOGLOBIN A1C
Hgb A1c MFr Bld: 7.6 % — ABNORMAL HIGH (ref 4.8–5.6)
Mean Plasma Glucose: 171.42 mg/dL

## 2023-10-01 LAB — GLUCOSE, CAPILLARY: Glucose-Capillary: 212 mg/dL — ABNORMAL HIGH (ref 70–99)

## 2023-10-01 LAB — CBC
HCT: 51.2 % (ref 39.0–52.0)
Hemoglobin: 17.4 g/dL — ABNORMAL HIGH (ref 13.0–17.0)
MCH: 32.2 pg (ref 26.0–34.0)
MCHC: 34 g/dL (ref 30.0–36.0)
MCV: 94.6 fL (ref 80.0–100.0)
Platelets: 179 10*3/uL (ref 150–400)
RBC: 5.41 MIL/uL (ref 4.22–5.81)
RDW: 12.4 % (ref 11.5–15.5)
WBC: 7.8 10*3/uL (ref 4.0–10.5)
nRBC: 0 % (ref 0.0–0.2)

## 2023-10-01 LAB — BASIC METABOLIC PANEL
Anion gap: 8 (ref 5–15)
BUN: 19 mg/dL (ref 8–23)
CO2: 26 mmol/L (ref 22–32)
Calcium: 8.9 mg/dL (ref 8.9–10.3)
Chloride: 100 mmol/L (ref 98–111)
Creatinine, Ser: 0.98 mg/dL (ref 0.61–1.24)
GFR, Estimated: >60 mL/min (ref >60)
Glucose, Bld: 201 mg/dL — ABNORMAL HIGH (ref 70–99)
Potassium: 3.8 mmol/L (ref 3.5–5.1)
Sodium: 134 mmol/L — ABNORMAL LOW (ref 135–145)

## 2023-10-01 NOTE — Progress Notes (Addendum)
COVID Vaccine Completed: no  Date of COVID positive in last 90 days: n/a  PCP - Dwyane Luo, MD Cardiologist - Dr. Chales Abrahams  EP study- 07/26/23 [facility] Chest x-ray - 04/29/23 Epic EKG - 08/10/23 [facility] Stress Test - n/a ECHO - 06/22/23 Epic  Cardiac Cath - n/a Pacemaker/ICD device last checked: n/a Spinal Cord Stimulator: n/a  Bowel Prep -   Sleep Study - yes CPAP - yes every night  Fasting Blood Sugar - 98-130 Checks Blood Sugar  3 times a week  Last dose of GLP1 agonist-  Trulicity, takes Sundays GLP1 instructions:  hold 10/07/23   Last dose of SGLT-2 inhibitors-  N/A SGLT-2 instructions: N/A   Blood Thinner Instructions:  Eliquis, hold 3 days Aspirin Instructions: Last Dose: 10/08/23 2100  Activity level: Can go up a flight of stairs and perform activities of daily living without stopping and without symptoms of chest pain or shortness of breath.   Anesthesia review: a fib, DM2, OSA, HFrEF  Patient denies shortness of breath, fever, cough and chest pain at PAT appointment  Patient verbalized understanding of instructions that were given to them at the PAT appointment. Patient was also instructed that they will need to review over the PAT instructions again at home before surgery.

## 2023-10-01 NOTE — Patient Instructions (Addendum)
SURGICAL WAITING ROOM VISITATION  Patients having surgery or a procedure may have no more than 2 support people in the waiting area - these visitors may rotate.    Children under the age of 99 must have an adult with them who is not the patient.  Due to an increase in RSV and influenza rates and associated hospitalizations, children ages 48 and under may not visit patients in [facility] hospitals.  If the patient needs to stay at the hospital during part of their recovery, the visitor guidelines for inpatient rooms apply. Pre-op nurse will coordinate an appropriate time for 1 support person to accompany patient in pre-op.  This support person may not rotate.    Please refer to the [facility] website for the visitor guidelines for Inpatients (after your surgery is over and you are in a regular room).    Your procedure is scheduled on: 10/12/23   Report to [facility] Main Entrance    Report to admitting at 6:45 AM   Call this number if you have problems the morning of surgery [phone]   Do not eat food :After Midnight.   After Midnight you may have the following liquids until 6:00 AM DAY OF SURGERY  Water Non-Citrus Juices (without pulp, NO RED-Apple, White grape, White cranberry) Black Coffee (NO MILK/CREAM OR CREAMERS, sugar ok)  Clear Tea (NO MILK/CREAM OR CREAMERS, sugar ok) regular and decaf                             Plain Jell-O (NO RED)                                           Fruit ices (not with fruit pulp, NO RED)                                     Popsicles (NO RED)                                                               Sports drinks like Gatorade (NO RED)    The day of surgery:  Drink ONE (1) Pre-Surgery G2 at 6:00 AM the morning of surgery. Drink in one sitting. Do not sip.  This drink was given to you during your hospital  pre-op appointment visit. Nothing else to drink after completing the  Pre-Surgery G2.          If you have questions,  please contact your surgeon's office.   FOLLOW BOWEL PREP AND ANY ADDITIONAL PRE OP INSTRUCTIONS YOU RECEIVED FROM YOUR SURGEON'S OFFICE!!!     Oral Hygiene is also important to reduce your risk of infection.                                    Remember - BRUSH YOUR TEETH THE MORNING OF SURGERY WITH YOUR REGULAR TOOTHPASTE  DENTURES WILL BE REMOVED PRIOR TO SURGERY PLEASE DO NOT APPLY "Poly grip" OR ADHESIVES!!!  Stop all vitamins and herbal supplements 7 days before surgery.   Take these medicines the morning of surgery with A SIP OF WATER: Metoprolol  These are anesthesia recommendations for holding your anticoagulants.  Please contact your prescribing physician to confirm IF it is safe to hold your anticoagulants for this length of time.   Eliquis Apixaban   72 hours   Xarelto Rivaroxaban   72 hours  Plavix Clopidogrel   120 hours  Pletal Cilostazol   120 hours    DO NOT TAKE ANY ORAL DIABETIC MEDICATIONS DAY OF YOUR SURGERY  How to Manage Your Diabetes Before and After Surgery  Why is it important to control my blood sugar before and after surgery? Improving blood sugar levels before and after surgery helps healing and can limit problems. A way of improving blood sugar control is eating a healthy diet by:  Eating less sugar and carbohydrates  Increasing activity/exercise  Talking with your doctor about reaching your blood sugar goals High blood sugars (greater than 180 mg/dL) can raise your risk of infections and slow your recovery, so you will need to focus on controlling your diabetes during the weeks before surgery. Make sure that the doctor who takes care of your diabetes knows about your planned surgery including the date and location.  How do I manage my blood sugar before surgery? Check your blood sugar at least 4 times a day, starting 2 days before surgery, to make sure that the level is not too high or low. Check your blood sugar the morning of your surgery when  you wake up and every 2 hours until you get to the Short Stay unit. If your blood sugar is less than 70 mg/dL, you will need to treat for low blood sugar: Do not take insulin. Treat a low blood sugar (less than 70 mg/dL) with  cup of clear juice (cranberry or apple), 4 glucose tablets, OR glucose gel. Recheck blood sugar in 15 minutes after treatment (to make sure it is greater than 70 mg/dL). If your blood sugar is not greater than 70 mg/dL on recheck, call [phone] for further instructions. Report your blood sugar to the short stay nurse when you get to Short Stay.  If you are admitted to the hospital after surgery: Your blood sugar will be checked by the staff and you will probably be given insulin after surgery (instead of oral diabetes medicines) to make sure you have good blood sugar levels. The goal for blood sugar control after surgery is 80-180 mg/dL.   WHAT DO I DO ABOUT MY DIABETES MEDICATION?  Do not take oral diabetes medicines (pills) the morning of surgery.  Hold Trulicity on 10/07/23.  THE NIGHT BEFORE SURGERY, take 50% of insulin.  THE MORNING OF SURGERY, do not take insulin  DO NOT TAKE THE FOLLOWING 7 DAYS PRIOR TO SURGERY: Ozempic, Wegovy, Rybelsus (Semaglutide), Byetta (exenatide), Bydureon (exenatide ER), Victoza, Saxenda (liraglutide), or Trulicity (dulaglutide) Mounjaro (Tirzepatide) Adlyxin (Lixisenatide), Polyethylene Glycol Loxenatide.  Reviewed and Endorsed by [facility] Patient Education Committee, August 2015  Bring CPAP mask and tubing day of surgery.                              You may not have any metal on your body including jewelry, and body piercing             Do not wear lotions, powders, cologne, or deodorant  Men may shave face and neck.   Do not bring valuables to the hospital. [facility] IS NOT             RESPONSIBLE   FOR VALUABLES.   Contacts, glasses, dentures or bridgework may not be worn into surgery.  DO NOT  BRING YOUR HOME MEDICATIONS TO THE HOSPITAL. PHARMACY WILL DISPENSE MEDICATIONS LISTED ON YOUR MEDICATION LIST TO YOU DURING YOUR ADMISSION IN THE HOSPITAL!    Patients discharged on the day of surgery will not be allowed to drive home.  Someone NEEDS to stay with you for the first 24 hours after anesthesia.              Please read over the following fact sheets you were given: IF YOU HAVE QUESTIONS ABOUT YOUR PRE-OP INSTRUCTIONS PLEASE CALL [phone] Hammond    If you received a COVID test during your pre-op visit  it is requested that you wear a mask when out in public, stay away from anyone that may not be feeling well and notify your surgeon if you develop symptoms. If you test positive for Covid or have been in contact with anyone that has tested positive in the last 10 days please notify you surgeon.    [facility] - Preparing for Surgery Before surgery, you can play an important role.  Because skin is not sterile, your skin needs to be as free of germs as possible.  You can reduce the number of germs on your skin by washing with CHG (chlorahexidine gluconate) soap before surgery.  CHG is an antiseptic cleaner which kills germs and bonds with the skin to continue killing germs even after washing. Please DO NOT use if you have an allergy to CHG or antibacterial soaps.  If your skin becomes reddened/irritated stop using the CHG and inform your nurse when you arrive at Short Stay. Do not shave (including legs and underarms) for at least 48 hours prior to the first CHG shower.  You may shave your face/neck.  Please follow these instructions carefully:  1.  Shower with CHG Soap the night before surgery and the  morning of surgery.  2.  If you choose to wash your hair, wash your hair first as usual with your normal  shampoo.  3.  After you shampoo, rinse your hair and body thoroughly to remove the shampoo.                             4.  Use CHG as you would any other liquid soap.  You can  apply chg directly to the skin and wash.  Gently with a scrungie or clean washcloth.  5.  Apply the CHG Soap to your body ONLY FROM THE NECK DOWN.   Do   not use on face/ open                           Wound or open sores. Avoid contact with eyes, ears mouth and   genitals (private parts).                       Wash face,  Genitals (private parts) with your normal soap.             6.  Wash thoroughly, paying special attention to the area where your    surgery  will be performed.  7.  Thoroughly  rinse your body with warm water from the neck down.  8.  DO NOT shower/wash with your normal soap after using and rinsing off the CHG Soap.                9.  Pat yourself dry with a clean towel.            10.  Wear clean pajamas.            11.  Place clean sheets on your bed the night of your first shower and do not  sleep with pets. Day of Surgery : Do not apply any lotions/deodorants the morning of surgery.  Please wear clean clothes to the hospital/surgery center.  FAILURE TO FOLLOW THESE INSTRUCTIONS MAY RESULT IN THE CANCELLATION OF YOUR SURGERY  PATIENT SIGNATURE_________________________________  NURSE SIGNATURE__________________________________  ________________________________________________________________________  David Hammond  An incentive spirometer is a tool that can help keep your lungs clear and active. This tool measures how well you are filling your lungs with each breath. Taking long deep breaths may help reverse or decrease the chance of developing breathing (pulmonary) problems (especially infection) following: A long period of time when you are unable to move or be active. BEFORE THE PROCEDURE  If the spirometer includes an indicator to show your best effort, your nurse or respiratory therapist will set it to a desired goal. If possible, sit up straight or lean slightly forward. Try not to slouch. Hold the incentive spirometer in an upright position. INSTRUCTIONS  FOR USE  Sit on the edge of your bed if possible, or sit up as far as you can in bed or on a chair. Hold the incentive spirometer in an upright position. Breathe out normally. Place the mouthpiece in your mouth and seal your lips tightly around it. Breathe in slowly and as deeply as possible, raising the piston or the ball toward the top of the column. Hold your breath for 3-5 seconds or for as long as possible. Allow the piston or ball to fall to the bottom of the column. Remove the mouthpiece from your mouth and breathe out normally. Rest for a few seconds and repeat Steps 1 through 7 at least 10 times every 1-2 hours when you are awake. Take your time and take a few normal breaths between deep breaths. The spirometer may include an indicator to show your best effort. Use the indicator as a goal to work toward during each repetition. After each set of 10 deep breaths, practice coughing to be sure your lungs are clear. If you have an incision (the cut made at the time of surgery), support your incision when coughing by placing a pillow or rolled up towels firmly against it. Once you are able to get out of bed, walk around indoors and cough well. You may stop using the incentive spirometer when instructed by your caregiver.  RISKS AND COMPLICATIONS Take your time so you do not get dizzy or light-headed. If you are in pain, you may need to take or ask for pain medication before doing incentive spirometry. It is harder to take a deep breath if you are having pain. AFTER USE Rest and breathe slowly and easily. It can be helpful to keep track of a log of your progress. Your caregiver can provide you with a simple table to help with this. If you are using the spirometer at home, follow these instructions: SEEK MEDICAL CARE IF:  You are having difficultly using the spirometer. You have trouble  using the spirometer as often as instructed. Your pain medication is not giving enough relief while using  the spirometer. You develop fever of 100.5 F (38.1 C) or higher. SEEK IMMEDIATE MEDICAL CARE IF:  You cough up bloody sputum that had not been present before. You develop fever of 102 F (38.9 C) or greater. You develop worsening pain at or near the incision site. MAKE SURE YOU:  Understand these instructions. Will watch your condition. Will get help right away if you are not doing well or get worse. Document Released: 04/09/2007 Document Revised: 02/19/2012 Document Reviewed: 06/10/2007 [facility] Patient Information 2014 [place], [place].   ________________________________________________________________________

## 2023-10-02 NOTE — Progress Notes (Signed)
Anesthesia Chart Review   Case: [id number] Date/Time: 10/12/23 0845   Procedures:      SHOULDER ARTHROSCOPY WITH SUBACROMIAL DECOMPRESSION (Right) - interscalene block 120     MINI OPEN ROTATOR CUFF REPAIR (Right: Shoulder) - interscalene block 120   Anesthesia type: Choice   Pre-op diagnosis: Right shoulder rotator cuff tear   Location: [facility] ROOM 06 / [facility]   Surgeons: David Low, MD       DISCUSSION:62 y.o. smoker with h/o sleep apnea, CHF, DM II, right shoulder rotator cuff tear scheduled for above procedure 10/12/2023 with Dr. Beverely Hammond.   Pt last seen by cardiology 08/10/2023. Pt s/p ablation of atrial flutter, per note he continues to experience atrial fibrillation.  Discussed ablation for this as well.  Will request cardiac clearance.  VS: BP (!) 153/86   Pulse 77   Resp 16   Ht 6' (1.829 m)   Wt (!) 143.8 kg   SpO2 99%   BMI 42.99 kg/m   PROVIDERS: David Nigel Berthold, FNP is PCP   Earnestine Mealing, MD is Cardiologist  LABS: Labs reviewed: Acceptable for surgery. (all labs ordered are listed, but only abnormal results are displayed)  Labs Reviewed  HEMOGLOBIN A1C - Abnormal; Notable for the following components:      Result Value   Hgb A1c MFr Bld 7.6 (*)    All other components within normal limits  BASIC METABOLIC PANEL - Abnormal; Notable for the following components:   Sodium 134 (*)    Glucose, Bld 201 (*)    All other components within normal limits  CBC - Abnormal; Notable for the following components:   Hemoglobin 17.4 (*)    All other components within normal limits  GLUCOSE, CAPILLARY - Abnormal; Notable for the following components:   Glucose-Capillary 212 (*)    All other components within normal limits     IMAGES:   EKG:   CV: Echo 06/22/2023 1. Left ventricular ejection fraction, by estimation, is 50%. The left  ventricle has Hammond normal function. The left ventricle has no regional wall  motion abnormalities. There is moderate  left ventricular hypertrophy. Left  ventricular diastolic parameters   are consistent with Grade I diastolic dysfunction (impaired relaxation).   2. Right ventricular systolic function is normal. The right ventricular  size is normal.   3. The mitral valve is normal in structure. No evidence of mitral valve  regurgitation. No evidence of mitral stenosis.   4. The aortic valve has an indeterminant number of cusps. Aortic valve  regurgitation is not visualized. No aortic stenosis is present.   5. The inferior vena cava is normal in size with greater than 50%  respiratory variability, suggesting right atrial pressure of 3 mmHg.   Past Medical History:  Diagnosis Date   Allergy 2016   Trazodone   Arthritis 1967   Knees   Atrial flutter (HCC)    Cardiomyopathy (HCC)    Chronic HFrEF (heart failure with reduced ejection fraction) (HCC)    Diabetes mellitus (HCC) 2019   GERD (gastroesophageal reflux disease) 2015   Hepatic steatosis    Hypercholesterolemia 04/29/2023   Morbid obesity (HCC)    Pneumonia    multile times   Sleep apnea 2015   Controlled with Tx CPAP    Past Surgical History:  Procedure Laterality Date   ACHILLES TENDON REPAIR Bilateral    CARDIOVERSION N/A 05/02/2023   Procedure: CARDIOVERSION;  Surgeon: Pricilla Riffle, MD;  Location: [facility];  Service: Cardiovascular;  Laterality: N/A;   HERNIA REPAIR  2012   Umbilical   TEE WITHOUT CARDIOVERSION N/A 05/02/2023   Procedure: TRANSESOPHAGEAL ECHOCARDIOGRAM (TEE);  Surgeon: Pricilla Riffle, MD;  Location: [facility];  Service: Cardiovascular;  Laterality: N/A;   VASECTOMY      MEDICATIONS:  apixaban (ELIQUIS) 5 MG TABS tablet   Continuous Glucose Sensor (FREESTYLE LIBRE 3 SENSOR) MISC   Dulaglutide (TRULICITY) 1.5 MG/0.5ML SOPN   folic acid (FOLVITE) 1 MG tablet   Insulin Glargine (BASAGLAR KWIKPEN) 100 UNIT/ML   Insulin Pen Needle (PEN NEEDLES) 31G X 8 MM MISC   metoprolol succinate (TOPROL XL) 25 MG 24 hr tablet    Needles & Syringes MISC   Needles & Syringes MISC   omega-3 acid ethyl esters (LOVAZA) 1 g capsule   SYRINGE/NEEDLE, DISP, 1 ML 23G X 1" 1 ML MISC   Syringe/Needle, Disp, 18G X 1" 1 ML MISC   Testosterone Cypionate 200 MG/ML SOLN   thiamine (VITAMIN B-1) 100 MG tablet   No current facility-administered medications for this encounter.      Jodell Cipro Ward, PA-C [facility] Pre-Surgical Testing [phone]

## 2023-10-04 DIAGNOSIS — G4733 Obstructive sleep apnea (adult) (pediatric): Secondary | ICD-10-CM | POA: Diagnosis not present

## 2023-10-11 NOTE — Anesthesia Preprocedure Evaluation (Addendum)
Anesthesia Evaluation  Patient identified by MRN, date of birth, ID band Patient awake    Reviewed: Allergy & Precautions, NPO status , Patient's Chart, lab work & pertinent test results, reviewed documented beta blocker date and time   Airway Mallampati: III  TM Distance: >3 FB Neck ROM: Full    Dental no notable dental hx. (+) Missing, Dental Advisory Given,    Pulmonary sleep apnea and Continuous Positive Airway Pressure Ventilation , Current SmokerPatient did not abstain from smoking.   Pulmonary exam normal breath sounds clear to auscultation       Cardiovascular hypertension, Pt. on home beta blockers and Pt. on medications +CHF (HFrEF)  Normal cardiovascular exam+ dysrhythmias (on Eliquis 10/28 stopped) Atrial Fibrillation  Rhythm:Regular Rate:Normal  Echo 06/2023  1. Left ventricular ejection fraction, by estimation, is 50%. The left  ventricle has low normal function. The left ventricle has no regional wall  motion abnormalities. There is moderate left ventricular hypertrophy. Left  ventricular diastolic parameters   are consistent with Grade I diastolic dysfunction (impaired relaxation).   2. Right ventricular systolic function is normal. The right ventricular  size is normal.   3. The mitral valve is normal in structure. No evidence of mitral valve  regurgitation. No evidence of mitral stenosis.   4. The aortic valve has an indeterminant number of cusps. Aortic valve  regurgitation is not visualized. No aortic stenosis is present.   5. The inferior vena cava is normal in size with greater than 50%  respiratory variability, suggesting right atrial pressure of 3 mmHg.      Neuro/Psych    GI/Hepatic Neg liver ROS,,,  Endo/Other  diabetes, Type 2, Insulin Dependent  Pt on GLP-1 Last dose 10/20  Renal/GU Lab Results      Component                Value               Date                           K                         3.8                 10/01/2023                CO2                      26                  10/01/2023                BUN                      19                  10/01/2023                CREATININE               0.98                10/01/2023                   Musculoskeletal  (+) Arthritis , Osteoarthritis,    Abdominal  (+) + obese (BMI 43)  Peds  Hematology Lab Results      Component                Value               Date                      WBC                      7.8                 10/01/2023                HGB                      17.4 (H)            10/01/2023                HCT                      51.2                10/01/2023                MCV                      94.6                10/01/2023                PLT                      179                 10/01/2023              Anesthesia Other Findings All TRAZADONE FenFibrate  Reproductive/Obstetrics                             Anesthesia Physical Anesthesia Plan  ASA: 3  Anesthesia Plan: General and Regional   Post-op Pain Management: Regional block* and Minimal or no pain anticipated   Induction: Intravenous  PONV Risk Score and Plan: 2 and Treatment may vary due to age or medical condition, Ondansetron and Midazolam  Airway Management Planned: Oral ETT and Video Laryngoscope Planned  Additional Equipment: None  Intra-op Plan:   Post-operative Plan: Extubation in OR  Informed Consent: I have reviewed the patients History and Physical, chart, labs and discussed the procedure including the risks, benefits and alternatives for the proposed anesthesia with the patient or authorized representative who has indicated his/her understanding and acceptance.     Dental advisory given  Plan Discussed with: CRNA  Anesthesia Plan Comments: (GA w ETT + R ISB w Exparel)        Anesthesia Quick Evaluation

## 2023-10-12 ENCOUNTER — Ambulatory Visit (HOSPITAL_COMMUNITY): Payer: Federal, State, Local not specified - PPO | Admitting: Physician Assistant

## 2023-10-12 ENCOUNTER — Ambulatory Visit (HOSPITAL_COMMUNITY): Payer: Federal, State, Local not specified - PPO | Admitting: Anesthesiology

## 2023-10-12 ENCOUNTER — Encounter (HOSPITAL_COMMUNITY): Payer: Self-pay | Admitting: Orthopedic Surgery

## 2023-10-12 ENCOUNTER — Encounter (HOSPITAL_COMMUNITY)
Admission: RE | Disposition: A | Discharge status: Home / Self Care | Payer: Self-pay | Source: Ambulatory Visit | Attending: Orthopedic Surgery

## 2023-10-12 ENCOUNTER — Ambulatory Visit (HOSPITAL_COMMUNITY)
Admission: RE | Admit: 2023-10-12 | Discharge: 2023-10-12 | Disposition: A | Discharge status: Home / Self Care | Payer: Federal, State, Local not specified - PPO | Source: Ambulatory Visit | Attending: Orthopedic Surgery | Admitting: Orthopedic Surgery

## 2023-10-12 DIAGNOSIS — S43431A Superior glenoid labrum lesion of right shoulder, initial encounter: Secondary | ICD-10-CM | POA: Diagnosis not present

## 2023-10-12 DIAGNOSIS — Z7985 Long-term (current) use of injectable non-insulin antidiabetic drugs: Secondary | ICD-10-CM | POA: Insufficient documentation

## 2023-10-12 DIAGNOSIS — S46111A Strain of muscle, fascia and tendon of long head of biceps, right arm, initial encounter: Secondary | ICD-10-CM | POA: Diagnosis not present

## 2023-10-12 DIAGNOSIS — F17290 Nicotine dependence, other tobacco product, uncomplicated: Secondary | ICD-10-CM | POA: Diagnosis not present

## 2023-10-12 DIAGNOSIS — M75121 Complete rotator cuff tear or rupture of right shoulder, not specified as traumatic: Secondary | ICD-10-CM | POA: Insufficient documentation

## 2023-10-12 DIAGNOSIS — M75101 Unspecified rotator cuff tear or rupture of right shoulder, not specified as traumatic: Secondary | ICD-10-CM | POA: Diagnosis not present

## 2023-10-12 DIAGNOSIS — M7551 Bursitis of right shoulder: Secondary | ICD-10-CM | POA: Diagnosis not present

## 2023-10-12 DIAGNOSIS — I5022 Chronic systolic (congestive) heart failure: Secondary | ICD-10-CM | POA: Diagnosis not present

## 2023-10-12 DIAGNOSIS — I4891 Unspecified atrial fibrillation: Secondary | ICD-10-CM | POA: Diagnosis not present

## 2023-10-12 DIAGNOSIS — E119 Type 2 diabetes mellitus without complications: Secondary | ICD-10-CM | POA: Diagnosis not present

## 2023-10-12 DIAGNOSIS — F17210 Nicotine dependence, cigarettes, uncomplicated: Secondary | ICD-10-CM | POA: Insufficient documentation

## 2023-10-12 DIAGNOSIS — S46011A Strain of muscle(s) and tendon(s) of the rotator cuff of right shoulder, initial encounter: Secondary | ICD-10-CM | POA: Diagnosis not present

## 2023-10-12 DIAGNOSIS — Z794 Long term (current) use of insulin: Secondary | ICD-10-CM | POA: Diagnosis not present

## 2023-10-12 DIAGNOSIS — X58XXXA Exposure to other specified factors, initial encounter: Secondary | ICD-10-CM | POA: Diagnosis not present

## 2023-10-12 DIAGNOSIS — G8918 Other acute postprocedural pain: Secondary | ICD-10-CM | POA: Diagnosis not present

## 2023-10-12 DIAGNOSIS — I110 Hypertensive heart disease with heart failure: Secondary | ICD-10-CM | POA: Diagnosis not present

## 2023-10-12 DIAGNOSIS — I4892 Unspecified atrial flutter: Secondary | ICD-10-CM | POA: Diagnosis not present

## 2023-10-12 HISTORY — PX: SHOULDER OPEN ROTATOR CUFF REPAIR: SHX2407

## 2023-10-12 HISTORY — PX: SHOULDER ARTHROSCOPY WITH SUBACROMIAL DECOMPRESSION: SHX5684

## 2023-10-12 LAB — GLUCOSE, CAPILLARY
Glucose-Capillary: 184 mg/dL — ABNORMAL HIGH (ref 70–99)
Glucose-Capillary: 184 mg/dL — ABNORMAL HIGH (ref 70–99)
Glucose-Capillary: 190 mg/dL — ABNORMAL HIGH (ref 70–99)
Glucose-Capillary: 202 mg/dL — ABNORMAL HIGH (ref 70–99)

## 2023-10-12 SURGERY — SHOULDER ARTHROSCOPY WITH SUBACROMIAL DECOMPRESSION
Anesthesia: Regional | Site: Shoulder | Laterality: Right

## 2023-10-12 MED ORDER — CEFAZOLIN SODIUM-DEXTROSE 2-4 GM/100ML-% IV SOLN: 2.0000 g | INTRAVENOUS | Status: DC

## 2023-10-12 MED ORDER — MIDAZOLAM HCL 2 MG/2ML IJ SOLN
1.0000 mg | INTRAMUSCULAR | Status: AC
  Administered 2023-10-12: 2 mg via INTRAVENOUS
  Filled 2023-10-12: qty 2

## 2023-10-12 MED ORDER — EPHEDRINE 5 MG/ML INJ
INTRAVENOUS | Status: AC
  Filled 2023-10-12: qty 5

## 2023-10-12 MED ORDER — SUGAMMADEX SODIUM 200 MG/2ML IV SOLN
INTRAVENOUS | Status: DC | PRN
  Administered 2023-10-12: 400 mg via INTRAVENOUS

## 2023-10-12 MED ORDER — PHENYLEPHRINE HCL-NACL 20-0.9 MG/250ML-% IV SOLN
INTRAVENOUS | Status: AC
  Filled 2023-10-12: qty 250

## 2023-10-12 MED ORDER — ONDANSETRON HCL 4 MG PO TABS: 4.0000 mg | ORAL_TABLET | Freq: Three times a day (TID) | ORAL | 1 refills | Status: AC | PRN

## 2023-10-12 MED ORDER — DEXAMETHASONE SODIUM PHOSPHATE 4 MG/ML IJ SOLN
INTRAMUSCULAR | Status: DC | PRN
  Administered 2023-10-12: 4 mg via INTRAVENOUS

## 2023-10-12 MED ORDER — BUPIVACAINE-EPINEPHRINE 0.25% -1:200000 IJ SOLN
INTRAMUSCULAR | Status: AC
  Filled 2023-10-12: qty 1

## 2023-10-12 MED ORDER — SUCCINYLCHOLINE CHLORIDE 200 MG/10ML IV SOSY
PREFILLED_SYRINGE | INTRAVENOUS | Status: DC | PRN
  Administered 2023-10-12: 70 mg via INTRAVENOUS

## 2023-10-12 MED ORDER — DROPERIDOL 2.5 MG/ML IJ SOLN: 0.6250 mg | Freq: Once | INTRAMUSCULAR | Status: DC | PRN

## 2023-10-12 MED ORDER — PROPOFOL 10 MG/ML IV BOLUS
INTRAVENOUS | Status: AC
  Filled 2023-10-12: qty 20

## 2023-10-12 MED ORDER — KETOROLAC TROMETHAMINE 30 MG/ML IJ SOLN
INTRAMUSCULAR | Status: AC
  Filled 2023-10-12: qty 1

## 2023-10-12 MED ORDER — SODIUM CHLORIDE 0.9 % IR SOLN
Status: DC | PRN
  Administered 2023-10-12 (×2): 3000 mL

## 2023-10-12 MED ORDER — PHENYLEPHRINE 80 MCG/ML (10ML) SYRINGE FOR IV PUSH (FOR BLOOD PRESSURE SUPPORT)
PREFILLED_SYRINGE | INTRAVENOUS | Status: DC | PRN
  Administered 2023-10-12: 80 ug via INTRAVENOUS

## 2023-10-12 MED ORDER — BUPIVACAINE HCL (PF) 0.5 % IJ SOLN
INTRAMUSCULAR | Status: DC | PRN
  Administered 2023-10-12: 15 mL via PERINEURAL

## 2023-10-12 MED ORDER — FENTANYL CITRATE (PF) 100 MCG/2ML IJ SOLN
INTRAMUSCULAR | Status: AC
  Filled 2023-10-12: qty 2

## 2023-10-12 MED ORDER — ROCURONIUM BROMIDE 10 MG/ML (PF) SYRINGE
PREFILLED_SYRINGE | INTRAVENOUS | Status: DC | PRN
  Administered 2023-10-12: 35 mg via INTRAVENOUS
  Administered 2023-10-12: 20 mg via INTRAVENOUS
  Administered 2023-10-12: 5 mg via INTRAVENOUS
  Administered 2023-10-12: 20 mg via INTRAVENOUS

## 2023-10-12 MED ORDER — STERILE WATER FOR IRRIGATION IR SOLN
Status: DC | PRN
  Administered 2023-10-12: 1000 mL

## 2023-10-12 MED ORDER — BUPIVACAINE-EPINEPHRINE 0.25% -1:200000 IJ SOLN
INTRAMUSCULAR | Status: DC | PRN
  Administered 2023-10-12: 30 mL

## 2023-10-12 MED ORDER — FENTANYL CITRATE PF 50 MCG/ML IJ SOSY
50.0000 ug | PREFILLED_SYRINGE | INTRAMUSCULAR | Status: AC
  Administered 2023-10-12: 100 ug via INTRAVENOUS
  Filled 2023-10-12: qty 2

## 2023-10-12 MED ORDER — METHOCARBAMOL 500 MG PO TABS: 500.0000 mg | ORAL_TABLET | Freq: Three times a day (TID) | ORAL | 1 refills | Status: AC | PRN

## 2023-10-12 MED ORDER — CHLORHEXIDINE GLUCONATE 0.12 % MT SOLN
15.0000 mL | Freq: Once | OROMUCOSAL | Status: DC
Start: 2023-10-12 — End: 2023-10-12

## 2023-10-12 MED ORDER — CEFAZOLIN IN SODIUM CHLORIDE 3-0.9 GM/100ML-% IV SOLN
3.0000 g | Freq: Once | INTRAVENOUS | Status: AC
  Administered 2023-10-12: 3 g via INTRAVENOUS

## 2023-10-12 MED ORDER — LIDOCAINE HCL (PF) 2 % IJ SOLN
INTRAMUSCULAR | Status: AC
  Filled 2023-10-12: qty 5

## 2023-10-12 MED ORDER — LACTATED RINGERS IV SOLN: INTRAVENOUS | Status: DC

## 2023-10-12 MED ORDER — BUPIVACAINE LIPOSOME 1.3 % IJ SUSP
INTRAMUSCULAR | Status: DC | PRN
  Administered 2023-10-12: 10 mL via PERINEURAL

## 2023-10-12 MED ORDER — DEXAMETHASONE SODIUM PHOSPHATE 10 MG/ML IJ SOLN
INTRAMUSCULAR | Status: AC
  Filled 2023-10-12: qty 1

## 2023-10-12 MED ORDER — TRANEXAMIC ACID-NACL 1000-0.7 MG/100ML-% IV SOLN
1000.0000 mg | INTRAVENOUS | Status: AC
  Administered 2023-10-12: 1000 mg via INTRAVENOUS
  Filled 2023-10-12: qty 100

## 2023-10-12 MED ORDER — ONDANSETRON HCL 4 MG/2ML IJ SOLN
INTRAMUSCULAR | Status: DC | PRN
  Administered 2023-10-12: 4 mg via INTRAVENOUS

## 2023-10-12 MED ORDER — OXYCODONE HCL 5 MG/5ML PO SOLN: 5.0000 mg | Freq: Once | ORAL | Status: DC | PRN

## 2023-10-12 MED ORDER — SUCCINYLCHOLINE CHLORIDE 200 MG/10ML IV SOSY
PREFILLED_SYRINGE | INTRAVENOUS | Status: AC
  Filled 2023-10-12: qty 10

## 2023-10-12 MED ORDER — PHENYLEPHRINE HCL-NACL 20-0.9 MG/250ML-% IV SOLN
INTRAVENOUS | Status: DC | PRN
  Administered 2023-10-12: 40 ug/min via INTRAVENOUS
  Administered 2023-10-12: 80 ug/min via INTRAVENOUS

## 2023-10-12 MED ORDER — HYDROMORPHONE HCL 1 MG/ML IJ SOLN
0.2500 mg | INTRAMUSCULAR | Status: DC | PRN
Start: 2023-10-12 — End: 2023-10-12

## 2023-10-12 MED ORDER — OXYCODONE HCL 5 MG PO TABS: 5.0000 mg | ORAL_TABLET | ORAL | 0 refills | Status: AC | PRN

## 2023-10-12 MED ORDER — ORAL CARE MOUTH RINSE
15.0000 mL | Freq: Once | OROMUCOSAL | Status: DC
Start: 2023-10-12 — End: 2023-10-12

## 2023-10-12 MED ORDER — ROCURONIUM BROMIDE 10 MG/ML (PF) SYRINGE
PREFILLED_SYRINGE | INTRAVENOUS | Status: AC
  Filled 2023-10-12: qty 10

## 2023-10-12 MED ORDER — CEFAZOLIN IN SODIUM CHLORIDE 3-0.9 GM/100ML-% IV SOLN
INTRAVENOUS | Status: AC
  Filled 2023-10-12: qty 100

## 2023-10-12 MED ORDER — PROPOFOL 10 MG/ML IV BOLUS
INTRAVENOUS | Status: DC | PRN
  Administered 2023-10-12: 270 mg via INTRAVENOUS

## 2023-10-12 MED ORDER — ONDANSETRON HCL 4 MG/2ML IJ SOLN
INTRAMUSCULAR | Status: AC
Start: 2023-10-12 — End: ?
  Filled 2023-10-12: qty 2

## 2023-10-12 MED ORDER — ACETAMINOPHEN 10 MG/ML IV SOLN: 1000.0000 mg | Freq: Once | INTRAVENOUS | Status: DC | PRN

## 2023-10-12 MED ORDER — EPHEDRINE SULFATE-NACL 50-0.9 MG/10ML-% IV SOSY
PREFILLED_SYRINGE | INTRAVENOUS | Status: DC | PRN
  Administered 2023-10-12 (×3): 5 mg via INTRAVENOUS
  Administered 2023-10-12: 10 mg via INTRAVENOUS
  Administered 2023-10-12 (×3): 5 mg via INTRAVENOUS

## 2023-10-12 MED ORDER — FENTANYL CITRATE (PF) 100 MCG/2ML IJ SOLN
INTRAMUSCULAR | Status: DC | PRN
  Administered 2023-10-12: 100 ug via INTRAVENOUS
  Administered 2023-10-12 (×2): 25 ug via INTRAVENOUS

## 2023-10-12 MED ORDER — KETOROLAC TROMETHAMINE 30 MG/ML IJ SOLN
INTRAMUSCULAR | Status: DC | PRN
  Administered 2023-10-12: 30 mg via INTRAVENOUS

## 2023-10-12 MED ORDER — OXYCODONE HCL 5 MG PO TABS: 5.0000 mg | ORAL_TABLET | Freq: Once | ORAL | Status: DC | PRN

## 2023-10-12 MED ORDER — INSULIN ASPART 100 UNIT/ML IJ SOLN
0.0000 [IU] | INTRAMUSCULAR | Status: AC | PRN
  Administered 2023-10-12 (×3): 4 [IU] via SUBCUTANEOUS
  Filled 2023-10-12: qty 1

## 2023-10-12 SURGICAL SUPPLY — 76 items
AID PSTN UNV HD RSTRNT DISP (MISCELLANEOUS) ×2
ANCH SUT 2 FBRTK BLU WHT (Anchor) ×2 IMPLANT
ANCH SUT FBRTK 1.3 2 TPE (Anchor) ×4 IMPLANT
ANCHOR FBRTK 2.6 SUTURETAP 1.3 (Anchor) IMPLANT
ANCHOR SUT FBRTK 2 WIRE (Anchor) IMPLANT
BAG COUNTER SPONGE SURGICOUNT (BAG) IMPLANT
BAG SPEC THK2 15X12 ZIP CLS (MISCELLANEOUS) ×2
BAG SPNG CNTER NS LX DISP (BAG)
BAG ZIPLOCK 12X15 (MISCELLANEOUS) ×2 IMPLANT
BIT DRILL FLEX 1.7 SHR FBRTK (DRILL) IMPLANT
BLADE AVERAGE 25X9 (BLADE) IMPLANT
BLADE EXCALIBUR 4.0X13 (MISCELLANEOUS) IMPLANT
BURR OVAL 8 FLU 4.0X13 (MISCELLANEOUS) IMPLANT
COVER SURGICAL LIGHT HANDLE (MISCELLANEOUS) ×2 IMPLANT
CUTTER BONE 4.0MM X 13CM (MISCELLANEOUS) IMPLANT
DISSECTOR 3.8MM X 13CM (MISCELLANEOUS) IMPLANT
DRAPE INCISE IOBAN 66X45 STRL (DRAPES) ×2 IMPLANT
DRAPE SHEET LG 3/4 BI-LAMINATE (DRAPES) ×2 IMPLANT
DRAPE STERI 35X30 U-POUCH (DRAPES) ×2 IMPLANT
DRAPE SURG 17X11 SM STRL (DRAPES) ×2 IMPLANT
DRAPE SURG IRRIG POUCH 19X23 (DRAPES) IMPLANT
DRAPE SURG ORHT 6 SPLT 77X108 (DRAPES) ×4 IMPLANT
DRAPE U-SHAPE 47X51 STRL (DRAPES) ×2 IMPLANT
DRILL FLEX 1.7 SHARP OBT FBRTK (DRILL) ×2
DRSG EMULSION OIL 3X3 NADH (GAUZE/BANDAGES/DRESSINGS) ×2 IMPLANT
DURAPREP 26ML APPLICATOR (WOUND CARE) ×2 IMPLANT
ELECT NDL TIP 2.8 STRL (NEEDLE) ×2 IMPLANT
ELECT NEEDLE TIP 2.8 STRL (NEEDLE) ×2
ELECT REM PT RETURN 15FT ADLT (MISCELLANEOUS) ×2 IMPLANT
GAUZE PAD ABD 7.5X8 STRL (GAUZE/BANDAGES/DRESSINGS) IMPLANT
GAUZE SPONGE 4X4 12PLY STRL (GAUZE/BANDAGES/DRESSINGS) ×2 IMPLANT
GLOVE BIOGEL PI IND STRL 7.5 (GLOVE) ×2 IMPLANT
GLOVE BIOGEL PI IND STRL 8.5 (GLOVE) ×2 IMPLANT
GLOVE ORTHO TXT STRL SZ7.5 (GLOVE) ×2 IMPLANT
GLOVE SURG ORTHO 8.5 STRL (GLOVE) ×2 IMPLANT
GOWN STRL REUS W/ TWL XL LVL3 (GOWN DISPOSABLE) ×4 IMPLANT
GOWN STRL REUS W/TWL XL LVL3 (GOWN DISPOSABLE) ×4
KIT BASIN OR (CUSTOM PROCEDURE TRAY) ×2 IMPLANT
KIT TURNOVER KIT A (KITS) IMPLANT
MANIFOLD NEPTUNE II (INSTRUMENTS) ×2 IMPLANT
NDL MAYO 6 CRC TAPER PT (NEEDLE) IMPLANT
NDL MAYO CATGUT SZ4 TPR NDL (NEEDLE) IMPLANT
NDL TAPERED W/ NITINOL LOOP (MISCELLANEOUS) IMPLANT
NEEDLE MAYO 6 CRC TAPER PT (NEEDLE)
NEEDLE MAYO CATGUT SZ4 (NEEDLE) ×2
NEEDLE TAPERED W/ NITINOL LOOP (MISCELLANEOUS) ×2
NS IRRIG 1000ML POUR BTL (IV SOLUTION) ×2 IMPLANT
PACK SHOULDER (CUSTOM PROCEDURE TRAY) ×2 IMPLANT
PASSER SUT SWANSON 36MM LOOP (INSTRUMENTS) IMPLANT
RESTRAINT HEAD UNIVERSAL NS (MISCELLANEOUS) ×2 IMPLANT
SLING ARM IMMOBILIZER LRG (SOFTGOODS) IMPLANT
SLING ARM IMMOBILIZER MED (SOFTGOODS) IMPLANT
SLING ULTRA II L (ORTHOPEDIC SUPPLIES) IMPLANT
SPIKE FLUID TRANSFER (MISCELLANEOUS) ×2 IMPLANT
SPONGE T-LAP 4X18 ~~LOC~~+RFID (SPONGE) IMPLANT
STAPLER SKIN PROX WIDE 3.9 (STAPLE) IMPLANT
STRIP CLOSURE SKIN 1/2X4 (GAUZE/BANDAGES/DRESSINGS) ×2 IMPLANT
SUT BONE WAX W31G (SUTURE) ×2 IMPLANT
SUT FIBERWIRE #2 38 T-5 BLUE (SUTURE) ×14
SUT FIBERWIRE 2-0 18 17.9 3/8 (SUTURE) ×8
SUT MNCRL AB 4-0 PS2 18 (SUTURE) ×2 IMPLANT
SUT VIC AB 0 CT1 27 (SUTURE)
SUT VIC AB 0 CT1 27XBRD ANBCTR (SUTURE) IMPLANT
SUT VIC AB 0 CT1 36 (SUTURE) IMPLANT
SUT VIC AB 0 CT2 27 (SUTURE) ×4 IMPLANT
SUT VIC AB 2-0 CT1 27 (SUTURE) ×4
SUT VIC AB 2-0 CT1 TAPERPNT 27 (SUTURE) ×4 IMPLANT
SUTURE FIBERWR #2 38 T-5 BLUE (SUTURE) IMPLANT
SUTURE FIBERWR 2-0 18 17.9 3/8 (SUTURE) IMPLANT
SYR BULB IRRIG 60ML STRL (SYRINGE) ×2 IMPLANT
TOWEL OR 17X26 10 PK STRL BLUE (TOWEL DISPOSABLE) ×2 IMPLANT
TOWEL OR NON WOVEN STRL DISP B (DISPOSABLE) ×2 IMPLANT
TUBING ARTHROSCOPY IRRIG 16FT (MISCELLANEOUS) ×2 IMPLANT
TUBING CONNECTING 10 (TUBING) ×2 IMPLANT
WAND ABLATOR APOLLO I90 (BUR) ×2 IMPLANT
YANKAUER SUCT BULB TIP NO VENT (SUCTIONS) IMPLANT

## 2023-10-12 NOTE — Discharge Instructions (Addendum)
Ice to the shoulder constantly.  Keep the incision covered and clean and dry for 5 days, then ok to get it wet in the shower. May remove band dage on Monday, leave steri strips on  Do exercise as instructed several times per day.  DO NOT reach behind your back or push up out of a chair with the operative arm.  Use a sling while you are up and around for comfort, may remove while seated.  Keep pillow propped behind the operative elbow.  Follow up with Dr Ranell Patrick in two weeks in the office, call [phone] for appt  Please call Dr Ranell Patrick (cell) [phone] with any questions or concerns.

## 2023-10-12 NOTE — Brief Op Note (Signed)
10/12/2023  12:13 PM  PATIENT:  David Hammond  62 y.o. male  PRE-OPERATIVE DIAGNOSIS:  Right shoulder rotator cuff tear, SLAP tear  POST-OPERATIVE DIAGNOSIS:  Right shoulder rotator cuff tear, SLAP tear  PROCEDURE:  Right shoulder arthroscopy with arthroscopic subacromial decompression, mini-open rotator cuff repair and biceps tenodesis  SURGEON:  Surgeons and Role:    Beverely Low, MD - Primary  PHYSICIAN ASSISTANT:   ASSISTANTS: Donnie Coffin. Dixon, PA-C   ANESTHESIA:   regional and general  EBL:  25 mL   BLOOD ADMINISTERED:none  DRAINS: none   LOCAL MEDICATIONS USED:  MARCAINE     SPECIMEN:  No Specimen  DISPOSITION OF SPECIMEN:  N/A  COUNTS:  YES  TOURNIQUET:  * No tourniquets in log *  DICTATION: .Other Dictation: Dictation Number [id number]  PLAN OF CARE: Discharge to home after PACU  PATIENT DISPOSITION:  PACU - hemodynamically stable.   Delay start of Pharmacological VTE agent (>24hrs) due to surgical blood loss or risk of bleeding: not applicable

## 2023-10-12 NOTE — Transfer of Care (Signed)
Immediate Anesthesia Transfer of Care Note  Patient: David Hammond  Procedure(s) Performed: Procedure(s) with comments: SHOULDER ARTHROSCOPY WITH SUBACROMIAL DECOMPRESSION (Right) - interscalene block 120 MINI OPEN ROTATOR CUFF REPAIR (Right) - interscalene block 120  Patient Location: PACU  Anesthesia Type:General and Regional  Level of Consciousness: Patient easily awoken, comfortable, cooperative, following commands, responds to stimulation.   Airway & Oxygen Therapy: Patient spontaneously breathing, ventilating well, oxygen via simple oxygen mask.  Post-op Assessment: Report given to PACU RN, vital signs reviewed and stable, moving all extremities.   Post vital signs: Reviewed and stable.  Complications: No apparent anesthesia complications  Last Vitals:  Vitals Value Taken Time  BP 110/67 10/12/23 1206  Temp    Pulse 85 10/12/23 1208  Resp 19 10/12/23 1208  SpO2 97 % 10/12/23 1208  Vitals shown include unfiled device data.  Last Pain:  Vitals:   10/12/23 0719  TempSrc:   PainSc: 0-No pain      Patients Stated Pain Goal: 5 (10/12/23 0719)  Complications:  Encounter Notable Events  Notable Event Outcome Phase Comment  Difficult to intubate - expected  Intraprocedure Filed from anesthesia note documentation.

## 2023-10-12 NOTE — Anesthesia Procedure Notes (Signed)
Procedure Name: Intubation Date/Time: 10/12/2023 9:32 AM  Performed by: Ludwig Lean, CRNAPre-anesthesia Checklist: Patient identified, Emergency Drugs available, Suction available and Patient being monitored Patient Re-evaluated:Patient Re-evaluated prior to induction Oxygen Delivery Method: Circle system utilized Preoxygenation: Pre-oxygenation with 100% oxygen Induction Type: IV induction Ventilation: Mask ventilation without difficulty Laryngoscope Size: Glidescope and 4 Grade View: Grade I Tube type: Oral Tube size: 7.5 mm Number of attempts: 1 Airway Equipment and Method: Stylet, Rigid stylet and Video-laryngoscopy Placement Confirmation: ETT inserted through vocal cords under direct vision, positive ETCO2 and breath sounds checked- equal and bilateral Secured at: 23 cm Tube secured with: Tape Dental Injury: Teeth and Oropharynx as per pre-operative assessment  Difficulty Due To: Difficulty was anticipated, Difficult Airway- due to large tongue and Difficult Airway- due to anterior larynx

## 2023-10-12 NOTE — Op Note (Unsigned)
David Hammond, David Hammond MEDICAL RECORD NO: [id number] ACCOUNT NO: [id number] DATE OF BIRTH: 03/22/1961 FACILITY: [facility] LOCATION: [facility] PHYSICIAN: Almedia Balls. Ranell Patrick, MD  Operative Report   DATE OF PROCEDURE: 10/12/2023  PREOPERATIVE DIAGNOSES: 1.  Right shoulder rotator cuff tear. 2.  SLAP tear.  POSTOPERATIVE DIAGNOSES: 1.  Right shoulder rotator cuff tear. 2.  SLAP tear.  PROCEDURES PERFORMED: 1.  Right shoulder arthroscopy with arthroscopic subacromial decompression. 2.  Mini-open rotator cuff repair. 3.  Biceps tenodesis in the groove.  ATTENDING SURGEON: Almedia Balls. Ranell Patrick, MD  ASSISTANT:  Konrad Felix Dixon, [place], who was scrubbed during the entire procedure and necessary for satisfactory completion of surgery.  ANESTHESIA:  General anesthesia was used plus interscalene block.  ESTIMATED BLOOD LOSS:  Minimal.  FLUID REPLACEMENT:  1500 mL of crystalloids.  COUNTS:  Instrument counts were correct.  COMPLICATIONS:  There were no complications.  ANTIBIOTICS:  Perioperative antibiotics were given.  INDICATIONS:  The patient is a 62 year old male who presents with a history of worsening right shoulder pain and dysfunction secondary to full-thickness rotator cuff tear.  The patient also has a SLAP tear.  We discussed options and recommended surgery  including shoulder arthroscopy with arthroscopic subacromial decompression with mini-open cuff repair and biceps tenodesis.  Risks and benefits of surgery were discussed.  Informed consent was obtained.  DESCRIPTION OF PROCEDURE:  After an adequate level of general anesthesia plus interscalene block was achieved, the patient was positioned in a modified beach chair position.  Right shoulder was correctly identified and sterile prep and drape performed.   Timeout called verifying correct patient and the correct site.  We entered the patient's shoulder using standard arthroscopic portals including anterior, posterior and lateral portals.   We identified significant intraarticular synovitis and tearing of  the superior labrum.  The biceps was retracted in the joint.  There was a small split tear present in the biceps within the biceps sheath.  We released the biceps with basket forceps and debrided the superior labrum with baskets as well.  There was some  other labral fraying and damage that was treated with the shaver.  The articular cartilage was in pretty good shape with minimal chondromalacia.  The subscapularis had a split tear at the top, but basically was intact.  Supraspinatus and infraspinatus  were torn.  The teres minor was intact.  We placed the scope in the subacromial space.  A bursectomy was performed followed by an acromioplasty creating a type I acromial shape with a butcher block technique utilizing a high-speed bur.  We did release  the CA ligament.  The rotator cuff tear was visualized from the bursal surface as well.  We then concluded the arthroscopic portion of the surgery.  After the decompression, we made a small mini-open incision starting at the anterolateral border of the  acromion extending distally about 4 cm in the raphe between the anterior and lateral heads of the deltoid.  Dissection down through the subcutaneous tissues.  We split that deltoid raphe with a needle tip Bovie, placed our Arthrex retractor exposing the  subdeltoid space.  We found the biceps sheath, delivered the biceps tendon out of the wound, whipstitched that with #2 FiberWire suture to reinforce the tendon.  We then placed a single suture anchor through the floor of the biceps screw.  This was an  Arthrex anchor and brought that up through the biceps tendon in the reinforced area in a mattress fashion.  We tied that down flush.  We  then took the longitudinal whipstitch sutures up through the rotator interval and tied down using a soft tissue  bridge incorporating part of the subscapularis.  Once we had that double-fixed repair done, we  oversewed the soft tissue at the top part of the repair with #0 Vicryl figure-of-eight suture x 2.  We then went to the rotator cuff tear and placed a total of  four #2 FiberWire sutures in the free edge of the tendon to mobilize it medial to lateral.  We used a Cobb elevator to free up the tendon on both sides.  We were able to get that back anatomically reduced, back onto the greater tuberosity where it was  torn from.  This involved supraspinatus and infraspinatus, it was about a 3 cm tear anterior to posterior.  We placed a couple of sutures in the rotator interval to secure that tendon back forward.  We then prepped the greater tuberosity with a rongeur  and curette getting down to bleeding bone and placing two of the SutureTak anchors double-loaded with ribbon that are the [facility] versions.  Those were placed in the subchondral bone and deployed one more posteriorly, one more anteriorly, but they were at the  junction of the greater tuberosity and the articular cartilage to restore the medial part of the footprint.  We brought this ribbon up in a double mattress fashion medially.  We took the free edge sutures down through drill holes and tied over the  lateral bone bridge out laterally and then also tied the rotator interval sutures.  We had a watertight repair, very strong double row.  We ranged the shoulder.  No motion noted and there was again no impingement.  We irrigated thoroughly and then  repaired the deltoid anatomically with 0 Vicryl suture followed by 2-0 Vicryl for subcutaneous closure and 4-0 Monocryl for skin and portals.  Steri-Strips applied followed by a sterile dressing.  The patient tolerated the surgery well.   MUK D: 10/12/2023 12:19:59 pm T: 10/12/2023 10:50:00 pm  JOB: [id number]/ [id number]

## 2023-10-12 NOTE — Anesthesia Procedure Notes (Signed)
Anesthesia Regional Block: Interscalene brachial plexus block   Pre-Anesthetic Checklist: , timeout performed,  Correct Patient, Correct Site, Correct Laterality,  Correct Procedure, Correct Position, site marked,  Risks and benefits discussed,  Surgical consent,  Pre-op evaluation,  At surgeon's request and post-op pain management  Laterality: Upper and Right  Prep: Maximum Sterile Barrier Precautions used, chloraprep       Needles:  Injection technique: Single-shot  Needle Type: Echogenic Needle     Needle Length: 5cm  Needle Gauge: 21     Additional Needles:   Procedures:,,,, ultrasound used (permanent image in chart),,    Narrative:  Start time: 10/12/2023 8:36 AM End time: 10/12/2023 8:43 AM Injection made incrementally with aspirations every 5 mL.  Performed by: Personally  Anesthesiologist: Trevor Iha, MD  Additional Notes: Block assessed prior to procedure. Patient tolerated procedure well.

## 2023-10-12 NOTE — Evaluation (Signed)
Occupational Therapy Evaluation Patient Details Name: David Hammond MRN: [id number] DOB: 1961/05/25 Today's Date: 10/12/2023   History of Present Illness [name] is a 62 yr old male who is s/p a R shoulder arthroscopy with arthroscopic subacromial decompression with mini rotator cuff tear repair and biceps tenodesis 10-12-23, due to R shoulder rotator cuff tear.   Clinical Impression   Pt is s/p surgery of non-dominant RUE on 10-12-23.  Therapist provided education and instruction to patient and spouse with regards to ROM/exercises, post-op precautions, UE and sling positioning, donning upper extremity clothing, use of ice for pain and edema management and correctly donning/doffing sling. Patient and spouse verbalized and demonstrated understanding as needed. Patient needed assistance to donn shirt, pants, and shoes with instruction on compensatory strategies to perform ADLs. Patient to follow up with MD for further therapy needs.         If plan is discharge home, recommend the following: Help with stairs or ramp for entrance;Assistance with cooking/housework    Functional Status Assessment  Patient has had a recent decline in their functional status and demonstrates the ability to make significant improvements in function in a reasonable and predictable amount of time.  Equipment Recommendations  None recommended by OT    Recommendations for Other Services       Precautions / Restrictions Precautions Precautions: Shoulder Type of Shoulder Precautions: sling at all times except ADLs/exercise, no shoulder PROM or AROM, okay to perform elbow, wrist and hand ROM Shoulder Interventions: Shoulder sling/immobilizer Precaution Booklet Issued: Yes (comment) Required Braces or Orthoses: Sling Restrictions Weight Bearing Restrictions: Yes RUE Weight Bearing: Non weight bearing      Mobility Bed Mobility      General bed mobility comments: pt received seated in chair     Transfers Overall transfer level: Needs assistance Equipment used: None Transfers: Sit to/from Stand Sit to Stand: Supervision         Balance Overall balance assessment: No apparent balance deficits (not formally assessed)          ADL either performed or assessed with clinical judgement               Pertinent Vitals/Pain Pain Assessment Pain Assessment: 0-10 Pain Score: 2  Pain Location: RUE Pain Intervention(s): Monitored during session     Extremity/Trunk Assessment Upper Extremity Assessment Upper Extremity Assessment: Left hand dominant           Communication Communication Communication: No apparent difficulties   Cognition Arousal: Alert Behavior During Therapy: WFL for tasks assessed/performed Overall Cognitive Status: Within Functional Limits for tasks assessed              General Comments: Oriented x4, able to follow commands without difficulty           Shoulder Instructions Shoulder Instructions Donning/doffing shirt without moving shoulder: Set-up;Supervision/safety (with pt's spouse performing task) Method for sponge bathing under operated UE: Caregiver independent with task Donning/doffing sling/immobilizer: Set-up;Supervision/safety (with pt's spouse performing task) Correct positioning of sling/immobilizer: Set-up;Supervision/safety (with pt's spouse performing task) Pendulum exercises (written home exercise program): Caregiver independent with task ROM for elbow, wrist and digits of operated UE: Caregiver independent with task;Patient able to independently direct caregiver Sling wearing schedule (on at all times/off for ADL's): Caregiver independent with task;Patient able to independently direct caregiver Proper positioning of operated UE when showering: Caregiver independent with task Dressing change:  (N/A) Positioning of UE while sleeping: Caregiver independent with task;Patient able to independently direct caregiver     Home  Living Family/patient expects to be discharged to:: Private residence Living Arrangements: Spouse/significant other;3 yr old son Available Help at Discharge: Family Type of Home: House Home Access: Stairs to enter Secretary/administrator of Steps: 1   Home Layout: Two level;Able to live on main level with bedroom/bathroom (2 story home with main and basement levels)     Bathroom Shower/Tub: Tub/shower unit             Additional Comments: CPAP      Prior Functioning/Environment Prior Level of Function : Independent/Modified Independent;Driving             Mobility Comments: Independent with ambulation. ADLs Comments: Independent with ADLs.        OT Problem List: Impaired UE functional use          AM-PAC OT "6 Clicks" Daily Activity     Outcome Measure Help from another person eating meals?: None Help from another person taking care of personal grooming?: None Help from another person toileting, which includes using toliet, bedpan, or urinal?: None Help from another person bathing (including washing, rinsing, drying)?: A Little Help from another person to put on and taking off regular upper body clothing?: A Little Help from another person to put on and taking off regular lower body clothing?: A Little 6 Click Score: 21   End of Session Equipment Utilized During Treatment: Other (comment) (N/A) Nurse Communication: Other (comment) (shoulder education completed)  Activity Tolerance: Patient tolerated treatment well Patient left: in chair;with call bell/phone within reach;with family/visitor present  OT Visit Diagnosis: Muscle weakness (generalized) (M62.81)                Time: 5621-3086 OT Time Calculation (min): 43 min Charges:  OT General Charges $OT Visit: 1 Visit OT Evaluation $OT Eval Moderate Complexity: 1 Mod OT Treatments $Self Care/Home Management : 8-22 mins    Reuben Likes, OTR/L 10/12/2023, 3:39 PM

## 2023-10-12 NOTE — Anesthesia Postprocedure Evaluation (Signed)
Anesthesia Post Note  Patient: Mercer Peifer  Procedure(s) Performed: SHOULDER ARTHROSCOPY WITH SUBACROMIAL DECOMPRESSION (Right) MINI OPEN ROTATOR CUFF REPAIR (Right: Shoulder)     Patient location during evaluation: PACU Anesthesia Type: Regional and General Level of consciousness: awake and alert Pain management: pain level controlled Vital Signs Assessment: post-procedure vital signs reviewed and stable Respiratory status: spontaneous breathing, nonlabored ventilation, respiratory function stable and patient connected to nasal cannula oxygen Cardiovascular status: blood pressure returned to baseline and stable Postop Assessment: no apparent nausea or vomiting Anesthetic complications: yes   Encounter Notable Events  Notable Event Outcome Phase Comment  Difficult to intubate - expected  Intraprocedure Filed from anesthesia note documentation.    Last Vitals:  Vitals:   10/12/23 1300 10/12/23 1330  BP: 110/82 122/77  Pulse: 89 84  Resp: (!) 21 18  Temp:  36.5 C  SpO2: 92% 94%    Last Pain:  Vitals:   10/12/23 1330  TempSrc: Oral  PainSc: 0-No pain                 Trevor Iha

## 2023-10-12 NOTE — Interval H&P Note (Signed)
History and Physical Interval Note:  10/12/2023 7:43 AM  David Hammond  has presented today for surgery, with the diagnosis of Right shoulder rotator cuff tear.  The various methods of treatment have been discussed with the patient and family. After consideration of risks, benefits and other options for treatment, the patient has consented to  Procedure(s) with comments: SHOULDER ARTHROSCOPY WITH SUBACROMIAL DECOMPRESSION (Right) - interscalene block 120 MINI OPEN ROTATOR CUFF REPAIR (Right) - interscalene block 120 as a surgical intervention.  The patient's history has been reviewed, patient examined, no change in status, stable for surgery.  I have reviewed the patient's chart and labs.  Questions were answered to the patient's satisfaction.     Verlee Rossetti

## 2023-10-15 ENCOUNTER — Encounter (HOSPITAL_COMMUNITY): Payer: Self-pay | Admitting: Orthopedic Surgery

## 2023-10-24 DIAGNOSIS — M25611 Stiffness of right shoulder, not elsewhere classified: Secondary | ICD-10-CM | POA: Diagnosis not present

## 2023-10-24 DIAGNOSIS — M25511 Pain in right shoulder: Secondary | ICD-10-CM | POA: Diagnosis not present

## 2023-10-26 DIAGNOSIS — M25511 Pain in right shoulder: Secondary | ICD-10-CM | POA: Diagnosis not present

## 2023-10-26 DIAGNOSIS — M25611 Stiffness of right shoulder, not elsewhere classified: Secondary | ICD-10-CM | POA: Diagnosis not present

## 2023-10-30 DIAGNOSIS — M25611 Stiffness of right shoulder, not elsewhere classified: Secondary | ICD-10-CM | POA: Diagnosis not present

## 2023-10-30 DIAGNOSIS — M25511 Pain in right shoulder: Secondary | ICD-10-CM | POA: Diagnosis not present

## 2023-11-06 DIAGNOSIS — M25511 Pain in right shoulder: Secondary | ICD-10-CM | POA: Diagnosis not present

## 2023-11-06 DIAGNOSIS — M25611 Stiffness of right shoulder, not elsewhere classified: Secondary | ICD-10-CM | POA: Diagnosis not present

## 2023-11-07 DIAGNOSIS — M25511 Pain in right shoulder: Secondary | ICD-10-CM | POA: Diagnosis not present

## 2023-11-07 DIAGNOSIS — M25611 Stiffness of right shoulder, not elsewhere classified: Secondary | ICD-10-CM | POA: Diagnosis not present

## 2023-11-26 DIAGNOSIS — Z8679 Personal history of other diseases of the circulatory system: Secondary | ICD-10-CM | POA: Diagnosis not present

## 2023-11-26 DIAGNOSIS — Z9889 Other specified postprocedural states: Secondary | ICD-10-CM | POA: Diagnosis not present

## 2023-11-26 DIAGNOSIS — I4892 Unspecified atrial flutter: Secondary | ICD-10-CM | POA: Diagnosis not present

## 2023-11-28 DIAGNOSIS — Z8679 Personal history of other diseases of the circulatory system: Secondary | ICD-10-CM | POA: Diagnosis not present

## 2023-11-28 DIAGNOSIS — I4892 Unspecified atrial flutter: Secondary | ICD-10-CM | POA: Diagnosis not present

## 2023-11-29 DIAGNOSIS — Z8679 Personal history of other diseases of the circulatory system: Secondary | ICD-10-CM | POA: Diagnosis not present

## 2023-11-29 DIAGNOSIS — Z9889 Other specified postprocedural states: Secondary | ICD-10-CM | POA: Diagnosis not present

## 2023-11-29 DIAGNOSIS — I4892 Unspecified atrial flutter: Secondary | ICD-10-CM | POA: Diagnosis not present

## 2024-07-01 ENCOUNTER — Other Ambulatory Visit: Payer: Self-pay | Admitting: Family Medicine
